# Patient Record
Sex: Female | Born: 1940 | Race: White | Hispanic: No | Marital: Married | Smoking: Former smoker
Health system: Southern US, Community
[De-identification: ages and names within clinical notes are randomized; demographics above are authoritative.]

## PROBLEM LIST (undated history)

## (undated) DIAGNOSIS — M5137 Other intervertebral disc degeneration, lumbosacral region: Secondary | ICD-10-CM

## (undated) DIAGNOSIS — M5441 Lumbago with sciatica, right side: Secondary | ICD-10-CM

## (undated) DIAGNOSIS — J449 Chronic obstructive pulmonary disease, unspecified: Secondary | ICD-10-CM

## (undated) DIAGNOSIS — I10 Essential (primary) hypertension: Secondary | ICD-10-CM

## (undated) HISTORY — PX: COLONOSCOPY: SHX174

## (undated) HISTORY — DX: Lumbago with sciatica, right side: M54.41

## (undated) HISTORY — DX: Essential (primary) hypertension: I10

## (undated) HISTORY — DX: Other intervertebral disc degeneration, lumbosacral region: M51.37

## (undated) HISTORY — DX: Chronic obstructive pulmonary disease, unspecified: J44.9

---

## 2010-01-15 HISTORY — PX: FRACTURE SURGERY: SHX138

## 2010-02-03 ENCOUNTER — Inpatient Hospital Stay (HOSPITAL_COMMUNITY): Admission: EM | Admit: 2010-02-03 | Discharge: 2010-02-05 | Payer: Self-pay | Admitting: Emergency Medicine

## 2010-11-02 LAB — DIFFERENTIAL
Basophils Absolute: 0 10*3/uL (ref 0.0–0.1)
Eosinophils Absolute: 0 10*3/uL (ref 0.0–0.7)
Monocytes Absolute: 0.6 10*3/uL (ref 0.1–1.0)
Monocytes Relative: 5 % (ref 3–12)
Neutro Abs: 10.1 10*3/uL — ABNORMAL HIGH (ref 1.7–7.7)
Neutrophils Relative %: 85 % — ABNORMAL HIGH (ref 43–77)

## 2010-11-02 LAB — COMPREHENSIVE METABOLIC PANEL WITH GFR
ALT: 10 U/L (ref 0–35)
AST: 16 U/L (ref 0–37)
Albumin: 4 g/dL (ref 3.5–5.2)
Alkaline Phosphatase: 52 U/L (ref 39–117)
BUN: 12 mg/dL (ref 6–23)
CO2: 22 meq/L (ref 19–32)
Calcium: 9.2 mg/dL (ref 8.4–10.5)
Chloride: 106 meq/L (ref 96–112)
Creatinine, Ser: 1.05 mg/dL (ref 0.4–1.2)
GFR calc non Af Amer: 52 mL/min — ABNORMAL LOW
Glucose, Bld: 111 mg/dL — ABNORMAL HIGH (ref 70–99)
Potassium: 4 meq/L (ref 3.5–5.1)
Sodium: 137 meq/L (ref 135–145)
Total Bilirubin: 0.7 mg/dL (ref 0.3–1.2)
Total Protein: 6.4 g/dL (ref 6.0–8.3)

## 2010-11-02 LAB — CBC
HCT: 29.5 % — ABNORMAL LOW (ref 36.0–46.0)
Hemoglobin: 10.1 g/dL — ABNORMAL LOW (ref 12.0–15.0)
MCHC: 33.6 g/dL (ref 30.0–36.0)
MCHC: 33.9 g/dL (ref 30.0–36.0)
MCHC: 34.1 g/dL (ref 30.0–36.0)
MCV: 85.2 fL (ref 78.0–100.0)
MCV: 85.3 fL (ref 78.0–100.0)
Platelets: 193 10*3/uL (ref 150–400)
Platelets: 214 K/uL (ref 150–400)
RBC: 3.46 MIL/uL — ABNORMAL LOW (ref 3.87–5.11)
RDW: 13.3 % (ref 11.5–15.5)
RDW: 13.3 % (ref 11.5–15.5)
RDW: 13.4 % (ref 11.5–15.5)
WBC: 9.3 K/uL (ref 4.0–10.5)

## 2010-11-02 LAB — BASIC METABOLIC PANEL WITH GFR
BUN: 8 mg/dL (ref 6–23)
CO2: 27 meq/L (ref 19–32)
Calcium: 8.4 mg/dL (ref 8.4–10.5)
Chloride: 108 meq/L (ref 96–112)
Creatinine, Ser: 0.97 mg/dL (ref 0.4–1.2)
GFR calc non Af Amer: 57 mL/min — ABNORMAL LOW
Glucose, Bld: 130 mg/dL — ABNORMAL HIGH (ref 70–99)
Potassium: 4.1 meq/L (ref 3.5–5.1)
Sodium: 141 meq/L (ref 135–145)

## 2010-11-02 LAB — URINALYSIS, ROUTINE W REFLEX MICROSCOPIC
Bilirubin Urine: NEGATIVE
Glucose, UA: NEGATIVE mg/dL
Hgb urine dipstick: NEGATIVE
Ketones, ur: NEGATIVE mg/dL
Nitrite: NEGATIVE
Protein, ur: NEGATIVE mg/dL
Specific Gravity, Urine: 1.011 (ref 1.005–1.030)
Urobilinogen, UA: 0.2 mg/dL (ref 0.0–1.0)
pH: 5.5 (ref 5.0–8.0)

## 2010-11-02 LAB — ABO/RH: ABO/RH(D): A POS

## 2010-11-02 LAB — TYPE AND SCREEN: Antibody Screen: NEGATIVE

## 2013-03-13 ENCOUNTER — Ambulatory Visit (INDEPENDENT_AMBULATORY_CARE_PROVIDER_SITE_OTHER): Payer: Medicare Other | Admitting: Physician Assistant

## 2013-03-13 ENCOUNTER — Encounter: Payer: Self-pay | Admitting: Physician Assistant

## 2013-03-13 VITALS — BP 190/114 | HR 84 | Temp 98.7°F | Resp 18 | Ht 60.75 in | Wt 136.0 lb

## 2013-03-13 DIAGNOSIS — M545 Low back pain, unspecified: Secondary | ICD-10-CM

## 2013-03-13 DIAGNOSIS — I10 Essential (primary) hypertension: Secondary | ICD-10-CM

## 2013-03-13 DIAGNOSIS — M5430 Sciatica, unspecified side: Secondary | ICD-10-CM

## 2013-03-13 DIAGNOSIS — M5431 Sciatica, right side: Secondary | ICD-10-CM

## 2013-03-13 MED ORDER — AMLODIPINE BESYLATE 5 MG PO TABS: 5.0000 mg | ORAL_TABLET | Freq: Every day | ORAL | Status: DC

## 2013-03-13 MED ORDER — PREDNISONE 20 MG PO TABS: ORAL_TABLET | ORAL | Status: DC

## 2013-03-13 MED ORDER — BENAZEPRIL HCL 10 MG PO TABS: 10.0000 mg | ORAL_TABLET | Freq: Every day | ORAL | Status: DC

## 2013-03-13 NOTE — Progress Notes (Signed)
Patient ID: Kristina Lucas MRN: [id number], DOB: 03-Dec-1940, 72 y.o. Date of Encounter: @DATE @  Chief Complaint:  Chief Complaint  Patient presents with  . New Pt-Establish    HPI: 72 y.o. year old white female  presents as new pt to establish care here. Has had no medical evaluation in 25 years. Had Right Hip surgery 3 years ago but "just had the surgery." No other medical care at all.   Has c/o pain in right buttock, going down lateral aspect of right leg. Was off and on but has been constant for past 6 weeks. Started after she had been bent over pulling weeds. She epsecially notices it when she first gets up in the mornings. She says Dr. Charlann Boxer told her it was not secondary to hre hip surgery.   She has no other complaints. Is interested in scheduling a CPE.    History reviewed. No pertinent past medical history.   Home Meds: See attached medication section for current medication list. Any medications entered into computer today will not appear on this note's list. The medications listed below were entered prior to today. No current outpatient prescriptions on file prior to visit.   No current facility-administered medications on file prior to visit.    Allergies: No Known Allergies  History   Social History  . Marital Status: Married    Spouse Name: N/A    Number of Children: N/A  . Years of Education: N/A   Occupational History  . Not on file.   Social History Main Topics  . Smoking status: Former Smoker -- 1.50 packs/day    Quit date: 04/17/2009  . Smokeless tobacco: Never Used  . Alcohol Use: No  . Drug Use: No  . Sexually Active: Not Currently   Other Topics Concern  . Not on file   Social History Narrative  . No narrative on file    Family History  Problem Relation Age of Onset  . Cancer Mother     colon  . Heart disease Father   . Hypertension Father   . Heart disease Brother      Review of Systems:  See HPI for pertinent ROS. All other ROS  negative.    Physical Exam: Blood pressure 190/114, pulse 84, temperature 98.7 F (37.1 C), temperature source Oral, resp. rate 18, height 5' 0.75" (1.543 m), weight 136 lb (61.689 kg)., Body mass index is 25.91 kg/(m^2).BP by me: 176/100 Left.  188/100 Right. General: WNWD WF. Appears in no acute distress. Neck: Supple. No thyromegaly. No lymphadenopathy.No carotid Bruit. Lungs: Clear bilaterally to auscultation without wheezes, rales, or rhonchi. Breathing is unlabored. Heart: RRR with S1 S2. No murmurs, rubs, or gallops. Musculoskeletal:  Strength and tone normal for age. Back: No pain with palpation of low back. She points o low back at approx L5-S1 level as area of occasional pain.  Left SLR, Hip Abd: Nml.  Right: SLR causes pain in low back at 45 degrees. Cannot abduct at all sec to ?hip pain-given her past surgery, I did not abduct.  Pain with palpation of Right Sciatic Notch.  2+ equal patella reflexes Bilaterally. Extremities/Skin: Warm and dry.  No edema. Neuro: Alert and oriented X 3. Moves all extremities spontaneously. Gait is normal. CNII-XII grossly in tact. Psych:  Responds to questions appropriately with a normal affect.     ASSESSMENT AND PLAN:  72 y.o. year old female with  1. HTN (hypertension) - COMPLETE METABOLIC PANEL WITH GFR - benazepril (LOTENSIN) 10 MG  tablet; Take 1 tablet (10 mg total) by mouth daily.  Dispense: 30 tablet; Refill: 0 - amLODipine (NORVASC) 5 MG tablet; Take 1 tablet (5 mg total) by mouth daily.  Dispense: 30 tablet; Refill: 0  2. Sciatica neuralgia, right - predniSONE (DELTASONE) 20 MG tablet; Take 3 daily for 2 days, then 2 daily for 2 days, then 1 daily for 2 days.  Dispense: 12 tablet; Refill: 0 - DG Lumbar Spine Complete; Future  3. Low back pain - DG Lumbar Spine Complete; Future  F/U OV 2 weeks, sooner if needed.  Will schedule CPE once these two issues are controlled.    [address] [place], [place], [facility] 03/13/2013 3:37  PM

## 2013-03-14 ENCOUNTER — Encounter: Payer: Self-pay | Admitting: Family Medicine

## 2013-03-14 ENCOUNTER — Ambulatory Visit
Admission: RE | Admit: 2013-03-14 | Discharge: 2013-03-14 | Disposition: A | Discharge status: Home / Self Care | Payer: Medicare Other | Source: Ambulatory Visit | Attending: Physician Assistant | Admitting: Physician Assistant

## 2013-03-14 DIAGNOSIS — M545 Low back pain: Secondary | ICD-10-CM

## 2013-03-14 DIAGNOSIS — M5431 Sciatica, right side: Secondary | ICD-10-CM

## 2013-03-14 LAB — COMPLETE METABOLIC PANEL WITH GFR
ALT: 11 U/L (ref 0–35)
AST: 18 U/L (ref 0–37)
Albumin: 4.7 g/dL (ref 3.5–5.2)
Alkaline Phosphatase: 53 U/L (ref 39–117)
BUN: 11 mg/dL (ref 6–23)
CO2: 27 mEq/L (ref 19–32)
Calcium: 10.1 mg/dL (ref 8.4–10.5)
Creat: 1 mg/dL (ref 0.50–1.10)
Glucose, Bld: 92 mg/dL (ref 70–99)
Total Protein: 7.2 g/dL (ref 6.0–8.3)

## 2013-03-27 ENCOUNTER — Encounter: Payer: Self-pay | Admitting: Physician Assistant

## 2013-03-27 ENCOUNTER — Ambulatory Visit (INDEPENDENT_AMBULATORY_CARE_PROVIDER_SITE_OTHER): Payer: Medicare Other | Admitting: Physician Assistant

## 2013-03-27 VITALS — BP 172/84 | HR 80 | Temp 98.6°F | Resp 18 | Wt 136.0 lb

## 2013-03-27 DIAGNOSIS — M5441 Lumbago with sciatica, right side: Secondary | ICD-10-CM

## 2013-03-27 DIAGNOSIS — I10 Essential (primary) hypertension: Secondary | ICD-10-CM

## 2013-03-27 DIAGNOSIS — M5430 Sciatica, unspecified side: Secondary | ICD-10-CM

## 2013-03-27 HISTORY — DX: Lumbago with sciatica, right side: M54.41

## 2013-03-27 MED ORDER — BENAZEPRIL HCL 20 MG PO TABS: 20.0000 mg | ORAL_TABLET | Freq: Every day | ORAL | Status: DC

## 2013-03-27 MED ORDER — AMLODIPINE BESYLATE 10 MG PO TABS: 10.0000 mg | ORAL_TABLET | Freq: Every day | ORAL | Status: DC

## 2013-03-28 NOTE — Progress Notes (Signed)
Patient ID: Kristina Lucas MRN: [id number], DOB: [birth year], 72 y.o. Date of Encounter: @DATE @  Chief Complaint:  Chief Complaint  Patient presents with  . 2 week follow up    legs still hurt    HPI: 72 y.o. year old white female  Presents for f/u OV. I saw her as a new pt to this practice 03/13/13. Prior to that, she had not had any medical evaluation in "25 years."  She presented with c/o pain in right buttock, going down lateral aspect of right leg. Had been intermittent for 6 weeks but was worsening. Had started after pulling weeds. Worse first thing in morning.  At that OV I prescribed Prednisone taper and obtained XRay Lumbar spine.  Today she reports that the pain never got better- even when on the prednisone.   Also, at LOV she was dx with HTN. Started Norvasc 5mg  and Benazepril 10mg . Seh is taking these QD. No adv effects. Seh has checked with BP cuff at home (which she thinks is accurate-her husband has used it and correlates with BP at his doctor's office.    Past Medical History  Diagnosis Date  . Hypertension   . Low back pain on right side with sciatica 03/27/2013     Home Meds: See attached medication section for current medication list. Any medications entered into computer today will not appear on this note's list. The medications listed below were entered prior to today. Current Outpatient Prescriptions on File Prior to Visit  Medication Sig Dispense Refill  . ibuprofen (ADVIL,MOTRIN) 200 MG tablet Take 200 mg by mouth every 6 (six) hours as needed for pain.       No current facility-administered medications on file prior to visit.    Allergies: No Known Allergies  History   Social History  . Marital Status: Married    Spouse Name: N/A    Number of Children: N/A  . Years of Education: N/A   Occupational History  . Not on file.   Social History Main Topics  . Smoking status: Former Smoker -- 1.50 packs/day    Quit date: 04/17/2009  . Smokeless tobacco:  Never Used  . Alcohol Use: No  . Drug Use: No  . Sexually Active: Not Currently   Other Topics Concern  . Not on file   Social History Narrative  . No narrative on file    Family History  Problem Relation Age of Onset  . Cancer Mother     colon  . Heart disease Father   . Hypertension Father   . Heart disease Brother      Review of Systems:  See HPI for pertinent ROS. All other ROS negative.    Physical Exam: Blood pressure 172/84, pulse 80, temperature 98.6 F (37 C), temperature source Oral, resp. rate 18, weight 136 lb (61.689 kg)., Body mass index is 25.91 kg/(m^2). BP by me 150/74 Right Arm General: WNWD WF. Appears in no acute distress. Neck: Supple. No thyromegaly. No lymphadenopathy.No carotid bruits. Lungs: Clear bilaterally to auscultation without wheezes, rales, or rhonchi. Breathing is unlabored. Heart: RRR with S1 S2. No murmurs, rubs, or gallops. Musculoskeletal:  Strength and tone normal for age. Back: No paoin with palpation of back. Positive pain with palpation of Right Sciatic notch. Left SLR, Hip Abd nml.  Unable to abduct on Right-has had hip surgery. 2+, equal patella reflexes bilaterally. Extremities/Skin: Warm and dry. No clubbing or cyanosis. No edema. No rashes or suspicious lesions. Neuro: Alert and oriented X  3. Moves all extremities spontaneously. Gait is normal. CNII-XII grossly in tact. Psych:  Responds to questions appropriately with a normal affect.     ASSESSMENT AND PLAN:  72 y.o. year old female with  1. 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 Will refer her to spine specialist and they can order any further imaging necessary  - Ambulatory referral to Orthopedic Surgery  2. Hypertension Will increase both meds: - amLODipine (NORVASC) 10 MG tablet; Take 1 tablet (10 mg total) by mouth  daily.  Dispense: 90 tablet; Refill: 3 - benazepril (LOTENSIN) 20 MG tablet; Take 1 tablet (20 mg total) by mouth daily.  Dispense: 90 tablet; Refill: 3 Sched f/u OV 2 weeks to recheck BP, BMET. She actually wants to schedule this as CPE. Will come fasting.   Murray Hodgkins [place], [place], [facility] 03/28/2013 6:46 AM

## 2013-04-12 ENCOUNTER — Encounter: Payer: Self-pay | Admitting: Internal Medicine

## 2013-04-12 ENCOUNTER — Ambulatory Visit (INDEPENDENT_AMBULATORY_CARE_PROVIDER_SITE_OTHER): Payer: Medicare Other | Admitting: Physician Assistant

## 2013-04-12 ENCOUNTER — Encounter: Payer: Self-pay | Admitting: Physician Assistant

## 2013-04-12 VITALS — BP 132/70 | HR 84 | Temp 98.2°F | Resp 20 | Ht 62.0 in | Wt 136.0 lb

## 2013-04-12 DIAGNOSIS — Z0000 Encounter for general adult medical examination without abnormal findings: Secondary | ICD-10-CM

## 2013-04-12 DIAGNOSIS — M5441 Lumbago with sciatica, right side: Secondary | ICD-10-CM

## 2013-04-12 DIAGNOSIS — Z800 Family history of malignant neoplasm of digestive organs: Secondary | ICD-10-CM

## 2013-04-12 DIAGNOSIS — I10 Essential (primary) hypertension: Secondary | ICD-10-CM

## 2013-04-12 DIAGNOSIS — Z23 Encounter for immunization: Secondary | ICD-10-CM

## 2013-04-12 DIAGNOSIS — M5430 Sciatica, unspecified side: Secondary | ICD-10-CM

## 2013-04-12 LAB — CBC WITH DIFFERENTIAL/PLATELET
Basophils Absolute: 0 10*3/uL (ref 0.0–0.1)
Eosinophils Absolute: 0.1 10*3/uL (ref 0.0–0.7)
Eosinophils Relative: 1 % (ref 0–5)
Lymphocytes Relative: 26 % (ref 12–46)
Lymphs Abs: 1.4 10*3/uL (ref 0.7–4.0)
MCH: 29 pg (ref 26.0–34.0)
Monocytes Relative: 7 % (ref 3–12)
Neutro Abs: 3.4 10*3/uL (ref 1.7–7.7)
RBC: 4.86 MIL/uL (ref 3.87–5.11)
RDW: 14 % (ref 11.5–15.5)

## 2013-04-12 LAB — COMPLETE METABOLIC PANEL WITH GFR
Albumin: 5 g/dL (ref 3.5–5.2)
Calcium: 10.1 mg/dL (ref 8.4–10.5)
Creat: 1.04 mg/dL (ref 0.50–1.10)
GFR, Est Non African American: 55 mL/min — ABNORMAL LOW
Glucose, Bld: 99 mg/dL (ref 70–99)
Sodium: 141 mEq/L (ref 135–145)
Total Protein: 7.5 g/dL (ref 6.0–8.3)

## 2013-04-12 LAB — LIPID PANEL
Cholesterol: 215 mg/dL — ABNORMAL HIGH (ref 0–200)
Triglycerides: 124 mg/dL (ref <150)

## 2013-04-12 NOTE — Progress Notes (Signed)
Patient ID: Kristina Lucas MRN: [id number], DOB: 13-Apr-1941, 72 y.o. Date of Encounter: 04/12/2013,   Chief Complaint: Physical (CPE)  HPI: 72 y.o. y/o white female  here for CPE.  She has no active complaints today.  I saw her as a new patient to this practice on 03/13/2013. Prior to that she had not had any medical evaluation for 25 years.  03/13/2013 she presented with complaints of pain in the right but radiating down the lateral aspect of the right leg. At that office visit I prescribed prednisone taper and obtained x-rays lumbar spine. She'll followup office visit 811 and 14. She reported that the pain never got better even while she was taking the prednisone. At that office visit on 03/28/2013 I was going to order an MRI. However then I reamed member that she has hardware in her hip. Therefore we instead scheduled her to see a spine specialist. Today she tells me that she did go to Dr. Cassandria Santee his office. She had a visit with his PA. She is currently in physical therapy. She has an appointment scheduled with Dr. Cassandria Santee on September 12. It was felt that hopefully the physical therapy can improve her condition. If it does not then he would probably do injections.  At her initial visit with meals 03/13/2013 she was also found to have hypertension. At that visit I started Norvasc 5 mg and benazepril 10 mg. At her followup office visit on 03/27/2013 blood pressure was improved but still elevated. We increased the Norvasc to 10 mg and increase the benazepril to 20 mg. Today she does report that she is taking both of these at the current doses. She is having no adverse effects. No lower extremity edema.   Review of Systems: Consitutional: No fever, chills, fatigue, night sweats, lymphadenopathy. No significant/unexplained weight changes. Eyes: No visual changes, eye redness, or discharge. ENT/Mouth: No ear pain, sore throat, nasal drainage, or sinus pain. Cardiovascular: No chest pressure,heaviness,  tightness or squeezing, even with exertion. No increased shortness of breath or dyspnea on exertion.No palpitations, edema, orthopnea, PND. Respiratory: No cough, hemoptysis, SOB, or wheezing. Gastrointestinal: No anorexia, dysphagia, reflux, pain, nausea, vomiting, hematemesis, diarrhea, constipation, BRBPR, or melena. Breast: No mass, nodules, bulging, or retraction. No skin changes or inflammation. No nipple discharge. No lymphadenopathy. Genitourinary: No dysuria, hematuria, incontinence, vaginal discharge, pruritis, burning, abnormal bleeding, or pain. Musculoskeletal: No decreased ROM, No joint pain or swelling. No significant pain in neck, back, or extremities. Skin: No rash, pruritis, or concerning lesions. Neurological: No headache, dizziness, syncope, seizures, tremors, memory loss, coordination problems, or paresthesias. Psychological: No anxiety, depression, hallucinations, SI/HI. Endocrine: No polydipsia, polyphagia, polyuria, or known diabetes.No increased fatigue. No palpitations/rapid heart rate. No significant/unexplained weight change. All other systems were reviewed and are otherwise negative.  Past Medical History  Diagnosis Date  . Hypertension   . Low back pain on right side with sciatica 03/27/2013     Past Surgical History  Procedure Laterality Date  . Fracture surgery Right 01/2010    hip    Home Meds:  Current Outpatient Prescriptions on File Prior to Visit  Medication Sig Dispense Refill  . amLODipine (NORVASC) 10 MG tablet Take 1 tablet (10 mg total) by mouth daily.  90 tablet  3  . benazepril (LOTENSIN) 20 MG tablet Take 1 tablet (20 mg total) by mouth daily.  90 tablet  3  . ibuprofen (ADVIL,MOTRIN) 200 MG tablet Take 200 mg by mouth every 6 (six) hours as needed for pain.  No current facility-administered medications on file prior to visit.    Allergies: No Known Allergies  History   Social History  . Marital Status: Married    Spouse Name:  N/A    Number of Children: N/A  . Years of Education: N/A   Occupational History  . Not on file.   Social History Main Topics  . Smoking status: Former Smoker -- 1.50 packs/day    Quit date: 04/17/2009  . Smokeless tobacco: Never Used  . Alcohol Use: No  . Drug Use: No  . Sexual Activity: Not Currently   Other Topics Concern  . Not on file   Social History Narrative   Married.    2 Children. Both Boys.   First Baby Died at 34 months old of an infection.   After had Third Baby stayed home for 15 years until her boys were grown..     Family History  Problem Relation Age of Onset  . Cancer Mother 15    colon  . Heart disease Father   . Hypertension Father   . Heart disease Brother     Physical Exam: Blood pressure 132/70, pulse 84, temperature 98.2 F (36.8 C), temperature source Oral, resp. rate 20, height 5\' 2"  (1.575 m), weight 136 lb (61.689 kg)., Body mass index is 24.87 kg/(m^2). General: Well developed, well nourished, white female. in no acute distress. HEENT: Normocephalic, atraumatic. Conjunctiva pink, sclera non-icteric. Pupils 2 mm constricting to 1 mm, round, regular, and equally reactive to light and accomodation. EOMI. Internal auditory canal clear. TMs with good cone of light and without pathology. Nasal mucosa pink. Nares are without discharge. No sinus tenderness. Oral mucosa pink.  Pharynx without exudate.   Neck: Supple. Trachea midline. No thyromegaly. Full ROM. No lymphadenopathy.No Carotid Bruits. Lungs: Clear to auscultation bilaterally without wheezes, rales, or rhonchi. Breathing is of normal effort and unlabored. Cardiovascular: RRR with S1 S2. No murmurs, rubs, or gallops. Distal pulses 2+ symmetrically. No carotid or abdominal bruits. Breast: Symmetrical. No masses. Nipples without discharge. Abdomen: Soft, non-tender, non-distended with normoactive bowel sounds. No hepatosplenomegaly or masses. No rebound/guarding. No CVA tenderness. No hernias.   Genitourinary:  External genitalia without lesions. Vaginal mucosa pink.No discharge present. Cervix pink and without discharge. No cervical tenderness.Normal uterus size. No adnexal mass or tenderness.  Pap smear taken. Musculoskeletal: Full range of motion and 5/5 strength throughout. Without swelling, atrophy, tenderness, crepitus, or warmth. Extremities without clubbing, cyanosis, or edema. Calves supple. Skin: Warm and moist without erythema, ecchymosis, wounds, or rash. Neuro: A+Ox3. CN II-XII grossly intact. Moves all extremities spontaneously. Full sensation throughout. Normal gait. DTR 2+ throughout upper and lower extremities. Finger to nose intact. Psych:  Responds to questions appropriately with a normal affect.   Assessment/Plan:  72 y.o. y/o female here for CPE 1. Visit for preventive health examination A. screening labs. She is fasting. - CBC with Differential - COMPLETE METABOLIC PANEL WITH GFR - Lipid panel - TSH - Vit D  25 hydroxy (rtn osteoporosis monitoring)  B. She's had no Pap smear in over 20 years. We'll go ahead and do Pap smear. - PAP, ThinPrep, Imaging, Medicare  C. screening colonoscopy: She says that she had one colonoscopy approximately 20 years ago. She is due for a colonoscopy regardless. However she also does have positive family history she definitely needs to proceed with colonoscopy. Her mom was diagnosed with colon cancer at age 28. She is agreeable to follow up with this. - Ambulatory referral to Gastroenterology  D. screening mammogram-she is not ever had one. She is agreeable E. screening bone density test: She has never had one she is agreeable-will schedule a mammogram and bone density to be at the same day same time - DG Bone Density; Future - MM Digital Screening; Future  E. Vaccines: Tetanus: She should have had this with her hip surgery just a few years ago Pneumococcal: She has never had one. We'll do right now and she would not need a  repeat. - Pneumococcal polysaccharide vaccine 23-valent greater than or equal to 2yo subcutaneous/IM Zostavax: She will need to check with her insurance regarding the cost of this.  2. Family history of colon cancer Followup with colonoscopy. See C above.   3. Hypertension Now at goal!  Continue current medication. - COMPLETE METABOLIC PANEL WITH GFR  4. Low back pain on right side with sciatica Followup with Dr. Cassandria Santee as above in history of present illness  5. Immunization due - Pneumococcal polysaccharide vaccine 23-valent greater than or equal to 2yo subcutaneous/IM  Regular office visit in 6 months sooner if needed.  [address] [place], [place], [facility] 04/12/2013 9:56 AM

## 2013-04-13 LAB — PAP, THIN PREP, IMAGING, MEDICARE

## 2013-04-14 ENCOUNTER — Other Ambulatory Visit: Payer: Self-pay | Admitting: Family Medicine

## 2013-04-14 DIAGNOSIS — M8580 Other specified disorders of bone density and structure, unspecified site: Secondary | ICD-10-CM

## 2013-05-09 ENCOUNTER — Ambulatory Visit
Admission: RE | Admit: 2013-05-09 | Discharge: 2013-05-09 | Disposition: A | Discharge status: Home / Self Care | Payer: Medicare Other | Source: Ambulatory Visit | Attending: Physician Assistant | Admitting: Physician Assistant

## 2013-05-09 DIAGNOSIS — M8580 Other specified disorders of bone density and structure, unspecified site: Secondary | ICD-10-CM

## 2013-05-09 DIAGNOSIS — Z0000 Encounter for general adult medical examination without abnormal findings: Secondary | ICD-10-CM

## 2013-05-09 LAB — HM DEXA SCAN: HM Dexa Scan: NORMAL

## 2013-05-11 ENCOUNTER — Other Ambulatory Visit: Payer: Self-pay | Admitting: Physician Assistant

## 2013-05-11 DIAGNOSIS — R928 Other abnormal and inconclusive findings on diagnostic imaging of breast: Secondary | ICD-10-CM

## 2013-05-24 ENCOUNTER — Encounter: Payer: Self-pay | Admitting: Family Medicine

## 2013-05-26 ENCOUNTER — Ambulatory Visit
Admission: RE | Admit: 2013-05-26 | Discharge: 2013-05-26 | Disposition: A | Discharge status: Home / Self Care | Payer: Medicare Other | Source: Ambulatory Visit | Attending: Physician Assistant | Admitting: Physician Assistant

## 2013-05-26 DIAGNOSIS — R928 Other abnormal and inconclusive findings on diagnostic imaging of breast: Secondary | ICD-10-CM

## 2013-05-30 HISTORY — PX: STERIOD INJECTION: SHX5046

## 2013-06-19 ENCOUNTER — Ambulatory Visit (AMBULATORY_SURGERY_CENTER): Payer: Self-pay

## 2013-06-19 VITALS — Ht 62.0 in | Wt 135.8 lb

## 2013-06-19 DIAGNOSIS — Z800 Family history of malignant neoplasm of digestive organs: Secondary | ICD-10-CM

## 2013-06-19 MED ORDER — MOVIPREP 100 G PO SOLR: 1.0000 | Freq: Once | ORAL | Status: DC

## 2013-06-20 ENCOUNTER — Encounter: Payer: Self-pay | Admitting: Internal Medicine

## 2013-07-03 ENCOUNTER — Encounter: Payer: Self-pay | Admitting: Internal Medicine

## 2013-07-03 ENCOUNTER — Ambulatory Visit (INDEPENDENT_AMBULATORY_CARE_PROVIDER_SITE_OTHER)
Admission: RE | Admit: 2013-07-03 | Discharge: 2013-07-03 | Disposition: A | Discharge status: Home / Self Care | Payer: Medicare Other | Source: Ambulatory Visit | Attending: Internal Medicine | Admitting: Internal Medicine

## 2013-07-03 ENCOUNTER — Ambulatory Visit (AMBULATORY_SURGERY_CENTER): Payer: Medicare Other | Admitting: Internal Medicine

## 2013-07-03 VITALS — BP 140/75 | HR 120 | Temp 97.0°F | Resp 27 | Ht 62.0 in | Wt 135.0 lb

## 2013-07-03 DIAGNOSIS — R0789 Other chest pain: Secondary | ICD-10-CM

## 2013-07-03 DIAGNOSIS — D126 Benign neoplasm of colon, unspecified: Secondary | ICD-10-CM

## 2013-07-03 DIAGNOSIS — Z1211 Encounter for screening for malignant neoplasm of colon: Secondary | ICD-10-CM

## 2013-07-03 DIAGNOSIS — Z800 Family history of malignant neoplasm of digestive organs: Secondary | ICD-10-CM

## 2013-07-03 MED ORDER — SODIUM CHLORIDE 0.9 % IV SOLN: 500.0000 mL | INTRAVENOUS | Status: DC

## 2013-07-03 MED ORDER — ALBUTEROL SULFATE (5 MG/ML) 0.5% IN NEBU: 2.5000 mg | INHALATION_SOLUTION | Freq: Once | RESPIRATORY_TRACT | Status: DC

## 2013-07-03 MED ORDER — ALBUTEROL SULFATE HFA 108 (90 BASE) MCG/ACT IN AERS: 1.0000 | INHALATION_SPRAY | RESPIRATORY_TRACT | Status: DC | PRN

## 2013-07-03 NOTE — Progress Notes (Signed)
Pt received as documented IV sedation slowly Pre O2 well.Increased O2 6 liters, Recently stopped smoking, coughing during procedure, pt allowed to wake up tolerated rest of procedure well. Sa02 decreased pt awake talking, repsonding, BBS course, suctioned many times for small amount secretions, order inhalational Albuterol in RR, awake HOB elevated Sa02 on 4 liters 90 % LB

## 2013-07-03 NOTE — Op Note (Addendum)
[facility] [address] [place], [address]   COLONOSCOPY PROCEDURE REPORT  PATIENT: Kristina Lucas, Kristina Lucas  MR#: [id number] BIRTHDATE: [birth year] , 72  yrs. old GENDER: Female ENDOSCOPIST: Beverley Fiedler, MD REFERRED XB:JYNW Dionicio Stall, PA-C PROCEDURE DATE:  07/03/2013 PROCEDURE:   Colonoscopy with snare polypectomy First Screening Colonoscopy - Avg.  risk and is 50 yrs.  old or older - No.  Prior Negative Screening - Now for repeat screening. 10 or more years since last screening  History of Adenoma - Now for follow-up colonoscopy & has been > or = to 3 yrs.  N/A  Polyps Removed Today? Yes. ASA CLASS:   Class III INDICATIONS:elevated risk screening and Patient's immediate family history of colon cancer.  last colonoscopy > 20 years ago MEDICATIONS: MAC sedation, administered by CRNA; propofol 80 mg IV  DESCRIPTION OF PROCEDURE:   After the risks benefits and alternatives of the procedure were thoroughly explained, informed consent was obtained.  A digital rectal exam revealed no rectal mass.   The LB [id number]-[id number]  endoscope was introduced through the anus and advanced to the cecum, which was identified by both the appendix and ileocecal valve. No adverse events experienced. The quality of the prep was good, using MoviPrep  The instrument was then slowly withdrawn as the colon was fully examined.   COLON FINDINGS: Three sessile polyps measuring 4-5 mm in size were found in the descending colon and sigmoid colon.  Polypectomy was performed using cold snare.  All resections were complete and all polyp tissue was completely retrieved.   There was moderate diverticulosis noted in the sigmoid colon with associated muscular hypertrophy.  Retroflexed views revealed no abnormalities. The time to cecum=4 minutes 01 seconds.  Withdrawal time=16 minutes 05 seconds.  The scope was withdrawn and the procedure completed. COMPLICATIONS: There were no complications.  ENDOSCOPIC  IMPRESSION: 1.   Three sessile polyps measuring 4-5 mm in size were found in the descending colon and sigmoid colon; Polypectomy was performed using cold snare 2.   There was moderate diverticulosis noted in the sigmoid colon  RECOMMENDATIONS: 1.  Await pathology results 2.  High fiber diet 3.  Timing of repeat colonoscopy will be determined by pathology findings. 4.  You will receive a letter within 1-2 weeks with the results of your biopsy as well as final recommendations.  Please call my office if you have not received a letter after 3 weeks.  eSigned:  Beverley Fiedler, MD 07/03/2013 10:32 AMRevised: 07/03/2013 10:32 AM   cc: The Patient and Frazier Richards MD

## 2013-07-03 NOTE — Progress Notes (Signed)
Called to room to assist during endoscopic procedure.  Patient ID and intended procedure confirmed with present staff. Received instructions for my participation in the procedure from the performing physician.  

## 2013-07-03 NOTE — Progress Notes (Addendum)
1610 pt brought to recovery sats in the low 80's, pt having chest tightness and wet productive cough. Per dr pyrtle, give 1 alb nebule. Neb given with chest tightness improving per pt. Pt was placed on 3 liters nasal cannula 02 and decreasing as tolerated per pt. ewm  Upon discharge, dr pyrtle ordered cxr before going home and pt aware. Script for albuterol inhaler sent to pt's pharmacy and pt aware needs to pick up today.  Pt sats 90 on room air and dr pyrtle reexamined pt prior to discharge. Dr pyrtle's Virgie Dad to call pt with appt with pcp per dr Rhea Belton. ewm  Patient did not experience any of the following events: a burn prior to discharge; a fall within the facility; wrong site/side/patient/procedure/implant event; or a hospital transfer or hospital admission upon discharge from the facility. ([id number])Patient did not have preoperative order for IV antibiotic SSI prophylaxis. [phone]) ewm

## 2013-07-03 NOTE — Patient Instructions (Signed)
YOU HAD AN ENDOSCOPIC PROCEDURE TODAY AT THE  ENDOSCOPY CENTER: Refer to the procedure report that was given to you for any specific questions about what was found during the examination.  If the procedure report does not answer your questions, please call your gastroenterologist to clarify.  If you requested that your care partner not be given the details of your procedure findings, then the procedure report has been included in a sealed envelope for you to review at your convenience later.  YOU SHOULD EXPECT: Some feelings of bloating in the abdomen. Passage of more gas than usual.  Walking can help get rid of the air that was put into your GI tract during the procedure and reduce the bloating. If you had a lower endoscopy (such as a colonoscopy or flexible sigmoidoscopy) you may notice spotting of blood in your stool or on the toilet paper. If you underwent a bowel prep for your procedure, then you may not have a normal bowel movement for a few days.  DIET: Your first meal following the procedure should be a light meal and then it is ok to progress to your normal diet.  A half-sandwich or bowl of soup is an example of a good first meal.  Heavy or fried foods are harder to digest and may make you feel nauseous or bloated.  Likewise meals heavy in dairy and vegetables can cause extra gas to form and this can also increase the bloating.  Drink plenty of fluids but you should avoid alcoholic beverages for 24 hours.  ACTIVITY: Your care partner should take you home directly after the procedure.  You should plan to take it easy, moving slowly for the rest of the day.  You can resume normal activity the day after the procedure however you should NOT DRIVE or use heavy machinery for 24 hours (because of the sedation medicines used during the test).    SYMPTOMS TO REPORT IMMEDIATELY: A gastroenterologist can be reached at any hour.  During normal business hours, 8:30 AM to 5:00 PM Monday through Friday,  call [phone].  After hours and on weekends, please call the GI answering service at [phone]  Emergency number who will take a message and have the physician on call contact you.   Following lower endoscopy (colonoscopy or flexible sigmoidoscopy):  Excessive amounts of blood in the stool  Significant tenderness or worsening of abdominal pains  Swelling of the abdomen that is new, acute  Fever of 100F or higher  FOLLOW UP: If any biopsies were taken you will be contacted by phone or by letter within the next 1-3 weeks.  Call your gastroenterologist if you have not heard about the biopsies in 3 weeks.  Our staff will call the home number listed on your records the next business day following your procedure to check on you and address any questions or concerns that you may have at that time regarding the information given to you following your procedure. This is a courtesy call and so if there is no answer at the home number and we have not heard from you through the emergency physician on call, we will assume that you have returned to your regular daily activities without incident.  SIGNATURES/CONFIDENTIALITY: You and/or your care partner have signed paperwork which will be entered into your electronic medical record.  These signatures attest to the fact that that the information above on your After Visit Summary has been reviewed and is understood.  Full responsibility of the   confidentiality of this discharge information lies with you and/or your care-partner.  Handouts on polyps, diverticulosis, high fiber diet 

## 2013-07-04 ENCOUNTER — Telehealth: Payer: Self-pay | Admitting: *Deleted

## 2013-07-04 NOTE — Telephone Encounter (Signed)
  Follow up Call-  Call back number 07/03/2013  Post procedure Call Back phone  # [phone]  Permission to leave phone message Yes     Patient questions:  Do you have a fever, pain , or abdominal swelling? no Pain Score  0 *  Have you tolerated food without any problems? yes  Have you been able to return to your normal activities? yes  Do you have any questions about your discharge instructions: Diet   no Medications  no Follow up visit  no  Do you have questions or concerns about your Care? no  Actions: * If pain score is 4 or above: No action needed, pain <4. Pt states she feels absolutely fine this am, no chest tightness, no shortness of breath. Pt states she has used her inhaler twice yesterday only and no use this am. She states she doesn't feel like she needs it so far. Pt to call and follow up with her PCP about her respiratory issues. ewm

## 2013-07-06 ENCOUNTER — Ambulatory Visit (INDEPENDENT_AMBULATORY_CARE_PROVIDER_SITE_OTHER): Payer: Medicare Other | Admitting: Physician Assistant

## 2013-07-06 ENCOUNTER — Encounter: Payer: Self-pay | Admitting: Physician Assistant

## 2013-07-06 VITALS — BP 110/72 | HR 100 | Temp 98.3°F | Resp 20 | Ht 61.0 in | Wt 134.0 lb

## 2013-07-06 DIAGNOSIS — J22 Unspecified acute lower respiratory infection: Secondary | ICD-10-CM

## 2013-07-06 DIAGNOSIS — J988 Other specified respiratory disorders: Secondary | ICD-10-CM

## 2013-07-06 DIAGNOSIS — J449 Chronic obstructive pulmonary disease, unspecified: Secondary | ICD-10-CM

## 2013-07-06 MED ORDER — AZITHROMYCIN 250 MG PO TABS: ORAL_TABLET | ORAL | Status: DC

## 2013-07-06 MED ORDER — PREDNISONE 20 MG PO TABS: ORAL_TABLET | ORAL | Status: DC

## 2013-07-06 NOTE — Progress Notes (Signed)
Patient ID: Kristina Lucas MRN: [id number], DOB: 1941/04/13, 72 y.o. Date of Encounter: 07/06/2013, 11:49 AM    Chief Complaint:  Chief Complaint  Patient presents with  . COPD    had cCXR done when she had colonoscopy on monday told her to see her PCP     HPI: 72 y.o. year old white female to followup the above.  I saw her as a new patient to this practice on 03/13/13. Prior to that she had not had any medical evaluation for 25 years.  At her initial visit I evaluated her regarding some pain in her right buttock radiating down the right leg. At that initial visit on same found that she had hypertension.  Most recently, I saw her for complete physical exam on 04/12/13. At that physical we reviewed that she had not had a colonoscopy in approximately 20 years.  She was agreeable to followup with this.  Today she reports that she did have her colonoscopy performed. She's reports the following: She states that they were able to complete the colonoscopy procedure. However states that towards the end of the procedure she developed some coughing and congestion. Says that when she started to "wake up "  she felt some burning in her throat and felt short of breath. She says that the doctor told her she was having an asthma attack. Says that she was treated with some fluids and breathing treatments. Says that they obtained a chest x-ray and prescribed her an albuterol inhaler to use at home.  I was able to read the progress notes documented by the staff during the procedure. The CRNA note does document that she had some coughing during the procedure. She tolerated the rest of the procedure well. States that she was given oxygen 6 L during the procedure. Their notes documents that the oxygen saturation decreased some. However at that point the patient was awake talking responding. Exam was notable for coarse bilateral breath sounds. She was suctioned many times with small amount of secretions. They  ordered albuterol inhalation. Oxygen sat was 90% on 4 L.  RN note 10 minutes later states that in the recovery area sats were in the low 80s, patient having chest tightness somewhat productive cough. Per Dr. Delray Alt was instructed to give 1 albuterol nebulizer. Nebulizer was given and chest tightness improved per patient. Patient was placed on 3 L nasal cannula oxygen and this was decreased decreased as tolerated. Upon discharge Dr. Delray Alt ordered chest x-ray prior to going patient going for him. Prescription was sent for albuterol inhaler to the patient's pharmacy for her to pick up.  Today patient reports that she is only needed to use albuterol inhaler twice since being home. However she says that she does feel some wheezing.  Says that she is noticing some wheezing now. It also she is noticing that she does have increased phlegm compared to her usual.  She quit smoking 4 years 6 months ago. Says that prior to this procedure she had no difficulty with shortness of breath wheezing or cough.     Home Meds: See attached medication section for any medications that were entered at today's visit. The computer does not put those onto this list.The following list is a list of meds entered prior to today's visit.   Current Outpatient Prescriptions on File Prior to Visit  Medication Sig Dispense Refill  . albuterol (PROVENTIL HFA;VENTOLIN HFA) 108 (90 BASE) MCG/ACT inhaler Inhale 1-2 puffs into the lungs every 4 (four)  hours as needed for wheezing or shortness of breath.  1 Inhaler  0  . amLODipine (NORVASC) 10 MG tablet Take 1 tablet (10 mg total) by mouth daily.  90 tablet  3  . benazepril (LOTENSIN) 20 MG tablet Take 1 tablet (20 mg total) by mouth daily.  90 tablet  3  . ibuprofen (ADVIL,MOTRIN) 200 MG tablet Take 200 mg by mouth every 6 (six) hours as needed for pain.       No current facility-administered medications on file prior to visit.    Allergies: No Known Allergies    Review of  Systems: See HPI for pertinent ROS. All other ROS negative.    Physical Exam: Blood pressure 110/72, pulse 100, temperature 98.3 F (36.8 C), temperature source Oral, resp. rate 20, height 5\' 1"  (1.549 m), weight 134 lb (60.782 kg)., Body mass index is 25.33 kg/(m^2). SaO2 was 96% on room air. General: WNWD WF.  Appears in no acute distress. HEENT: Normocephalic, atraumatic, eyes without discharge, sclera non-icteric, nares are without discharge. Bilateral auditory canals clear, TM's are without perforation, pearly grey and translucent with reflective cone of light bilaterally. Oral cavity moist, posterior pharynx without exudate, erythema, peritonsillar abscess, or post nasal drip.  Neck: Supple. No thyromegaly. No lymphadenopathy. Lungs: She has very slight wheeze on the left. On the right, lung is clear. Good air movement. Heart: Regular rhythm. No murmurs, rubs, or gallops. Msk:  Strength and tone normal for age. Extremities/Skin: Warm and dry. No clubbing or cyanosis. No edema. No rashes or suspicious lesions. Neuro: Alert and oriented X 3. Moves all extremities spontaneously. Gait is normal. CNII-XII grossly in tact. Psych:  Responds to questions appropriately with a normal affect.   She had chest x-ray performed 07/03/13. This shows changes consistent with COPD. Also showed  " slightly increased left perihilar markings versus prior study, cannot exclude subtle left perihilar/left lower lobe infiltrate."  ASSESSMENT AND PLAN:  72 y.o. year old female with  1. Lower respiratory infection - azithromycin (ZITHROMAX) 250 MG tablet; Day 1; Take 2 daily.   Days 2-5: Take 1 daily  Dispense: 6 tablet; Refill: 0 - predniSONE (DELTASONE) 20 MG tablet; Take 3 daily for 2 days, then 2 daily for 2 days, then 1 daily for 2 days.  Dispense: 12 tablet; Refill: 0  2. COPD (chronic obstructive pulmonary disease) - predniSONE (DELTASONE) 20 MG tablet; Take 3 daily for 2 days, then 2 daily for 2 days,  then 1 daily for 2 days.  Dispense: 12 tablet; Refill: 0  I told her to take the antibiotic and prednisone as directed above. Also told her to go ahead and use the albuterol inhaler 4 times a day for 5 days then can decrease to when necessary dosing. Follow up is symptoms worsen or do not resolve.  Signed, [address] [place], [place], [facility] 07/06/2013 11:49 AM

## 2013-07-07 ENCOUNTER — Encounter: Payer: Self-pay | Admitting: Internal Medicine

## 2013-07-12 ENCOUNTER — Encounter: Payer: Self-pay | Admitting: Family Medicine

## 2013-07-12 DIAGNOSIS — M51379 Other intervertebral disc degeneration, lumbosacral region without mention of lumbar back pain or lower extremity pain: Secondary | ICD-10-CM

## 2013-07-12 DIAGNOSIS — M5137 Other intervertebral disc degeneration, lumbosacral region: Secondary | ICD-10-CM

## 2013-07-12 HISTORY — DX: Other intervertebral disc degeneration, lumbosacral region without mention of lumbar back pain or lower extremity pain: M51.379

## 2013-07-12 HISTORY — DX: Other intervertebral disc degeneration, lumbosacral region: M51.37

## 2013-10-16 ENCOUNTER — Ambulatory Visit (INDEPENDENT_AMBULATORY_CARE_PROVIDER_SITE_OTHER): Payer: Medicare Other | Admitting: Physician Assistant

## 2013-10-16 ENCOUNTER — Encounter: Payer: Self-pay | Admitting: Physician Assistant

## 2013-10-16 VITALS — BP 164/86 | HR 80 | Temp 98.7°F | Resp 18 | Ht 62.25 in | Wt 131.0 lb

## 2013-10-16 DIAGNOSIS — I10 Essential (primary) hypertension: Secondary | ICD-10-CM

## 2013-10-16 DIAGNOSIS — Z23 Encounter for immunization: Secondary | ICD-10-CM

## 2013-10-16 LAB — BASIC METABOLIC PANEL WITH GFR
BUN: 21 mg/dL (ref 6–23)
CALCIUM: 9.9 mg/dL (ref 8.4–10.5)
CHLORIDE: 101 meq/L (ref 96–112)
CO2: 27 meq/L (ref 19–32)
Creat: 1.05 mg/dL (ref 0.50–1.10)
GFR, Est African American: 62 mL/min
GFR, Est Non African American: 54 mL/min — ABNORMAL LOW
GLUCOSE: 96 mg/dL (ref 70–99)
POTASSIUM: 5 meq/L (ref 3.5–5.3)
SODIUM: 139 meq/L (ref 135–145)

## 2013-10-16 MED ORDER — AMLODIPINE BESYLATE 10 MG PO TABS: 10.0000 mg | ORAL_TABLET | Freq: Every day | ORAL | Status: DC

## 2013-10-16 MED ORDER — BENAZEPRIL HCL 20 MG PO TABS: 20.0000 mg | ORAL_TABLET | Freq: Every day | ORAL | Status: DC

## 2013-10-17 NOTE — Progress Notes (Signed)
Patient ID: Lorene Klimas MRN: [id number], DOB: [birth year], 73 y.o. Date of Encounter: @DATE @  Chief Complaint:  Chief Complaint  Patient presents with  . 6 mth check up    HPI: 73 y.o. year old white female  presents for routine six-month followup visit.  She states that she has not yet taken her blood pressure medication this morning. Says that she has not been checking her blood pressure recently. Does have a blood pressure cuff at home that she can check her blood pressure easily.  Says that she needs me to send in a refill on her albuterol. Says that she had a virus earlier this month and had to use albuterol twice a day on some days this month. Other than around  the time of this virus, she had not been needing her albuterol at all.  I saw her as a new patient to this practice on 03/13/13. Prior to that she had not had any medical evaluation for 25 years.  On 03/13/2013 she presented with complaints of pain in the right buttock, radiating down the lateral aspect of the right leg. She subsequently underwent further evaluation and treatment by me and subsequently was referred to Dr. Nelva Bush. Since being seen at his office she has done physical therapy there. As well she says that she has had 1 injection by him. Says that the pain and symptoms have been much better and has not had to require any further injections as far.  At her initial visit with me 03/13/13 she was also found to have hypertension. We have gradually titrated her medications to where she is now currently on amlodipine 10 mg daily and benazepril 20 mg daily.  She came in and had a complete physical exam with me on 04/12/13. See that note for all preventive care details.   Past Medical History  Diagnosis Date  . Hypertension   . Low back pain on right side with sciatica 03/27/2013  . COPD (chronic obstructive pulmonary disease)   . DDD (degenerative disc disease), lumbosacral 07/12/2013     Home Meds: See attached  medication section for current medication list. Any medications entered into computer today will not appear on this note's list. The medications listed below were entered prior to today. Current Outpatient Prescriptions on File Prior to Visit  Medication Sig Dispense Refill  . albuterol (PROVENTIL HFA;VENTOLIN HFA) 108 (90 BASE) MCG/ACT inhaler Inhale 1-2 puffs into the lungs every 4 (four) hours as needed for wheezing or shortness of breath.  1 Inhaler  0  . ibuprofen (ADVIL,MOTRIN) 200 MG tablet Take 200 mg by mouth every 6 (six) hours as needed for pain.       No current facility-administered medications on file prior to visit.    Allergies: No Known Allergies  History   Social History  . Marital Status: Married    Spouse Name: N/A    Number of Children: N/A  . Years of Education: N/A   Occupational History  . Not on file.   Social History Main Topics  . Smoking status: Former Smoker -- 1.50 packs/day    Quit date: 04/17/2009  . Smokeless tobacco: Never Used  . Alcohol Use: No  . Drug Use: No  . Sexual Activity: Not Currently   Other Topics Concern  . Not on file   Social History Narrative   Married.    2 Children. Both Boys.   First Baby Died at 90 months old of an infection.   After  had Third Baby stayed home for 15 years until her boys were grown..     Family History  Problem Relation Age of Onset  . Cancer Mother 93    colon  . Colon cancer Mother   . Heart disease Father   . Hypertension Father   . Heart disease Brother      Review of Systems:  See HPI for pertinent ROS. All other ROS negative.    Physical Exam: Blood pressure 164/86, pulse 80, temperature 98.7 F (37.1 C), temperature source Oral, resp. rate 18, height 5' 2.25" (1.581 m), weight 131 lb (59.421 kg)., Body mass index is 23.77 kg/(m^2). General: WNWD WF. Appears in no acute distress. Neck: Supple. No thyromegaly. No lymphadenopathy. No carotid bruit. Lungs: Clear bilaterally to  auscultation without wheezes, rales, or rhonchi. Breathing is unlabored. Heart: RRR with S1 S2. No murmurs, rubs, or gallops. Musculoskeletal:  Strength and tone normal for age. Extremities/Skin: Warm and dry. No clubbing or cyanosis. No edema.  Neuro: Alert and oriented X 3. Moves all extremities spontaneously. Gait is normal. CNII-XII grossly in tact. Psych:  Responds to questions appropriately with a normal affect.     ASSESSMENT AND PLAN:  73 y.o. year old female with  1. Hypertension She will start checking her blood pressure at home. If gets > 725 systolic or >36 diastolic, then she is to call and schedule a followup visit with me. Not increase medications based on today's reading. Last reading was excellent at 644/03. - BASIC METABOLIC PANEL WITH GFR - benazepril (LOTENSIN) 20 MG tablet; Take 1 tablet (20 mg total) by mouth daily.  Dispense: 90 tablet; Refill: 3 - amLODipine (NORVASC) 10 MG tablet; Take 1 tablet (10 mg total) by mouth daily.  Dispense: 90 tablet; Refill: 3  2. Need for Tdap vaccination - Tdap vaccine greater than or equal to 7yo IM  The following section is copied from her CPE note done 04/12/13 so I can easily review the preventive care that was done at that time.: Assessment/Plan:  73 y.o. y/o female here for CPE  1. Visit for preventive health examination  A. screening labs. She is fasting.  - CBC with Differential  - COMPLETE METABOLIC PANEL WITH GFR  - Lipid panel  - TSH  - Vit D 25 hydroxy (rtn osteoporosis monitoring)  B. She's had no Pap smear in over 20 years. We'll go ahead and do Pap smear.  - PAP, ThinPrep, Imaging, Medicare  C. screening colonoscopy:  She says that she had one colonoscopy approximately 20 years ago. She is due for a colonoscopy regardless. However she also does have positive family history she definitely needs to proceed with colonoscopy. Her mom was diagnosed with colon cancer at age 106. She is agreeable to follow up with this.    - Ambulatory referral to Gastroenterology                                                                             TODAY SHE REPORTS SHE HAD THIS DONE--2 POLYPS--TOLD TO REPEAT 5 YEARS.  D. screening mammogram-she is not ever had one. She is agreeable   --TODAY SHE REPORTS SHE HAD THIS--HAD A 2ND MAMMO/ULTRASOUND TO CONFIRM--WAS NEGATIVE--F/U  1 YEAR  E. screening bone density test: She has never had one she is agreeable-will schedule a mammogram and bone density to be at the same day same time  - DG Bone Density; Future                                                                                                      --TODAY SHE REPORTS SHE HAD THIS--STARTED CALCIUM AND VITRAMIN D AS INSTRUCTED  - MM Digital Screening; Future  E. Vaccines:  Tetanus: She should have had this with her hip surgery just a few years ago -                     -ACTUALLY REALIZED TETANUS NOT UPDATED PRE-OP--PT AGREEABLE TO GET THIS TODAY  Pneumococcal: She has never had one. We'll do right now and she would not need a repeat. --GIVEN NEW GUIDELINES: --AT LEAST 12 MONTHS SHOULD LAPSE, THEN SHE WILL NEED                                                                                                                                                                       PREVNAR 13.  - Pneumococcal polysaccharide vaccine 23-valent greater than or equal to 2yo subcutaneous/IM   Zostavax: She will need to check with her insurance regarding the cost of this.  2. Family history of colon cancer  Followup with colonoscopy. See C above.  3. Hypertension  Now at goal! Continue current medication.  - COMPLETE METABOLIC PANEL WITH GFR  4. Low back pain on right side with sciatica  Followup with Dr. Staci Acosta as above in history of present illness  5. Immunization due  - Pneumococcal polysaccharide vaccine 23-valent greater than or equal to 2yo subcutaneous/IM   Signed, [facility] [place], [place], [facility] 10/17/2013 2:52 PM

## 2014-04-18 ENCOUNTER — Encounter: Payer: Self-pay | Admitting: Physician Assistant

## 2014-04-18 ENCOUNTER — Ambulatory Visit (INDEPENDENT_AMBULATORY_CARE_PROVIDER_SITE_OTHER): Payer: Medicare Other | Admitting: Physician Assistant

## 2014-04-18 VITALS — BP 140/74 | HR 72 | Temp 98.6°F | Resp 18 | Wt 133.0 lb

## 2014-04-18 DIAGNOSIS — Z23 Encounter for immunization: Secondary | ICD-10-CM

## 2014-04-18 DIAGNOSIS — I10 Essential (primary) hypertension: Secondary | ICD-10-CM

## 2014-04-18 DIAGNOSIS — J438 Other emphysema: Secondary | ICD-10-CM

## 2014-04-18 DIAGNOSIS — J439 Emphysema, unspecified: Secondary | ICD-10-CM

## 2014-04-18 LAB — BASIC METABOLIC PANEL WITH GFR
BUN: 18 mg/dL (ref 6–23)
CHLORIDE: 102 meq/L (ref 96–112)
CO2: 27 mEq/L (ref 19–32)
Calcium: 10.3 mg/dL (ref 8.4–10.5)
Creat: 1.06 mg/dL (ref 0.50–1.10)
GFR, Est African American: 61 mL/min
GFR, Est Non African American: 53 mL/min — ABNORMAL LOW
GLUCOSE: 93 mg/dL (ref 70–99)
POTASSIUM: 4.8 meq/L (ref 3.5–5.3)
SODIUM: 139 meq/L (ref 135–145)

## 2014-04-18 NOTE — Progress Notes (Signed)
Patient ID: Kristina Lucas MRN: [id number], DOB: May 16, 1941, 73 y.o. Date of Encounter: @DATE @  Chief Complaint:  Chief Complaint  Patient presents with  . Hypertension    HPI: 73 y.o. year old white female  presents for routine six-month followup visit.  I saw her as a new patient to this practice on 03/13/13. Prior to that she had not had any medical evaluation for 25 years.  On 03/13/2013 she presented with complaints of pain in the right buttock, radiating down the lateral aspect of the right leg. She subsequently underwent further evaluation and treatment by me and subsequently was referred to Dr. Nelva Bush. Since being seen at his office she has done physical therapy there. As well she says that she has had 1 injection by him. Says that the pain and symptoms have been much better and has not had to require any further injections as far.  At her initial visit with me 03/13/13 she was also found to have hypertension. We have gradually titrated her medications to where she is now currently on amlodipine 10 mg daily and benazepril 20 mg daily.  She came in and had a complete physical exam with me on 04/12/13.   Today she states that she has continued to feel very good. She stays active.  Today she is hoping that the weather is going to be get this Saturday so her grandchildren have their part they pool party as planned !!!!!  She states that she very rarely needs to use her albuterol inhaler. She is taking her blood pressure medications as directed and no adverse effects.  She has no complaints today.   Past Medical History  Diagnosis Date  . Hypertension   . Low back pain on right side with sciatica 03/27/2013  . COPD (chronic obstructive pulmonary disease)   . DDD (degenerative disc disease), lumbosacral 07/12/2013     Home Meds:  Outpatient Prescriptions Prior to Visit  Medication Sig Dispense Refill  . albuterol (PROVENTIL HFA;VENTOLIN HFA) 108 (90 BASE) MCG/ACT inhaler Inhale  1-2 puffs into the lungs every 4 (four) hours as needed for wheezing or shortness of breath.  1 Inhaler  0  . amLODipine (NORVASC) 10 MG tablet Take 1 tablet (10 mg total) by mouth daily.  90 tablet  3  . benazepril (LOTENSIN) 20 MG tablet Take 1 tablet (20 mg total) by mouth daily.  90 tablet  3  . Calcium Carbonate-Vitamin D (CALCIUM + D PO) Take 1 tablet by mouth 2 (two) times daily with a meal.      . ibuprofen (ADVIL,MOTRIN) 200 MG tablet Take 200 mg by mouth every 6 (six) hours as needed for pain.       No facility-administered medications prior to visit.     Allergies: No Known Allergies  History   Social History  . Marital Status: Married    Spouse Name: N/A    Number of Children: N/A  . Years of Education: N/A   Occupational History  . Not on file.   Social History Main Topics  . Smoking status: Former Smoker -- 1.50 packs/day    Quit date: 04/17/2009  . Smokeless tobacco: Never Used  . Alcohol Use: No  . Drug Use: No  . Sexual Activity: Not Currently   Other Topics Concern  . Not on file   Social History Narrative   Married.    2 Children. Both Boys.   First Baby Died at 9 months old of an infection.  After had Third Baby stayed home for 15 years until her boys were grown..     Family History  Problem Relation Age of Onset  . Cancer Mother 23    colon  . Colon cancer Mother   . Heart disease Father   . Hypertension Father   . Heart disease Brother      Review of Systems:  See HPI for pertinent ROS. All other ROS negative.    Physical Exam: Blood pressure 140/74, pulse 72, temperature 98.6 F (37 C), resp. rate 18, weight 133 lb (60.328 kg)., Body mass index is 24.14 kg/(m^2). General: WNWD WF. Appears in no acute distress. Neck: Supple. No thyromegaly. No lymphadenopathy. No carotid bruit. Lungs: Clear bilaterally to auscultation without wheezes, rales, or rhonchi. Breathing is unlabored. Heart: RRR with S1 S2. No murmurs, rubs, or  gallops. Musculoskeletal:  Strength and tone normal for age. Extremities/Skin: Warm and dry. No clubbing or cyanosis. No edema.  Neuro: Alert and oriented X 3. Moves all extremities spontaneously. Gait is normal. CNII-XII grossly in tact. Psych:  Responds to questions appropriately with a normal affect.     ASSESSMENT AND PLAN:  73 y.o. year old female with   1. Essential hypertension Blood pressure control. Check labs monitor. Continue current medication. - BASIC METABOLIC PANEL WITH GFR  2. Pulmonary emphysema, unspecified emphysema type This is controlled and stable. Continue albuterol when necessary.    2. Family history of colon cancer  She did followup with colonoscopy. See "C" below.     THE FOLLOWING IS COPIED FROM HER CPE 03/2013: PREVENTIVE CARE: A. screening labs. --Done 03/2013 - CBC with Differential  - COMPLETE METABOLIC PANEL WITH GFR  - Lipid panel  - TSH  - Vit D 25 hydroxy (rtn osteoporosis monitoring)   B. At [place] 03/2013 she reported that she had not had Pap smear in over 20 years.   - PAP, ThinPrep, Imaging, Medicare ---Done 03/2013  C. screening colonoscopy:  At [place] 03/2013 she said that she had one colonoscopy approximately 20 years ago. She was due for a colonoscopy regardless. However she also does have positive family history she definitely needs to proceed with colonoscopy. Her mom was diagnosed with colon cancer at age 25. She was agreeable to follow up with this.  I ordered Referral to GI at [place] 03/2013. She reports that she did have colonoscopy. Revealed 2 polyps. Told to repeat 5 years.  D. screening mammogram:  AT [facility] 03/2013 she reported she had never had one. She was agreeable   The followup office visit she reported that she had this done. Had a second mammogram/ultrasound to confirm. Was negative. Was told to followup one year.   E. screening bone density test:  At [facility] 8/2014She had never had one-- she was agreeable- scheduled bone density  At  Followup office visit she reported that she did have the bone density scan performed and stated that she did start calcium and vitamin D as instructed.  F. Vaccines:  Tetanus: Given here 10/16/2013  Pneumococcal: Pneumovax 23 --given here 04/12/2013.                                                                                         Prevnar 13---Given here 04/18/2014  Zostavax: She did check with her insurance regarding the cost of this. $95. Pt defers.     She will need to followup for routine followup visit in 6 months. Followup sooner if needed.   Marin Olp [place], [place], [facility] 04/18/2014 8:13 AM

## 2014-04-19 ENCOUNTER — Encounter: Payer: Self-pay | Admitting: *Deleted

## 2014-05-01 ENCOUNTER — Telehealth: Payer: Self-pay | Admitting: Physician Assistant

## 2014-05-01 NOTE — Telephone Encounter (Signed)
LM pt is needing to schedule [facility] AND CPE

## 2014-05-23 ENCOUNTER — Encounter: Payer: Medicare Other | Admitting: Physician Assistant

## 2014-10-18 ENCOUNTER — Ambulatory Visit (INDEPENDENT_AMBULATORY_CARE_PROVIDER_SITE_OTHER): Payer: Medicare Other | Admitting: Physician Assistant

## 2014-10-18 ENCOUNTER — Other Ambulatory Visit: Payer: Self-pay

## 2014-10-18 ENCOUNTER — Encounter: Payer: Self-pay | Admitting: Physician Assistant

## 2014-10-18 VITALS — BP 144/74 | HR 76 | Temp 98.3°F | Resp 18 | Ht 62.0 in | Wt 130.0 lb

## 2014-10-18 DIAGNOSIS — I10 Essential (primary) hypertension: Secondary | ICD-10-CM | POA: Diagnosis not present

## 2014-10-18 DIAGNOSIS — M5137 Other intervertebral disc degeneration, lumbosacral region: Secondary | ICD-10-CM

## 2014-10-18 DIAGNOSIS — R252 Cramp and spasm: Secondary | ICD-10-CM | POA: Diagnosis not present

## 2014-10-18 DIAGNOSIS — J439 Emphysema, unspecified: Secondary | ICD-10-CM | POA: Diagnosis not present

## 2014-10-18 DIAGNOSIS — Z1231 Encounter for screening mammogram for malignant neoplasm of breast: Secondary | ICD-10-CM

## 2014-10-18 LAB — BASIC METABOLIC PANEL WITH GFR
BUN: 13 mg/dL (ref 6–23)
CHLORIDE: 104 meq/L (ref 96–112)
CO2: 27 meq/L (ref 19–32)
Calcium: 9.6 mg/dL (ref 8.4–10.5)
Creat: 1.06 mg/dL (ref 0.50–1.10)
GFR, Est African American: 61 mL/min
GFR, Est Non African American: 53 mL/min — ABNORMAL LOW
Glucose, Bld: 92 mg/dL (ref 70–99)
Potassium: 4.6 mEq/L (ref 3.5–5.3)
SODIUM: 139 meq/L (ref 135–145)

## 2014-10-18 MED ORDER — CYCLOBENZAPRINE HCL 10 MG PO TABS: ORAL_TABLET | ORAL | Status: DC

## 2014-10-18 NOTE — Progress Notes (Signed)
Patient ID: Kristina Lucas MRN: [id number], DOB: 1941-04-18, 74 y.o. Date of Encounter: @DATE @  Chief Complaint:  Chief Complaint  Patient presents with  . 6 mth visit    is fasting    HPI: 74 y.o. year old white female  presents for routine six-month followup visit.  I saw her as a new patient to this practice on 03/13/13. Prior to that she had not had any medical evaluation for 25 years.  On 03/13/2013 she presented with complaints of pain in the right buttock, radiating down the lateral aspect of the right leg. She subsequently underwent further evaluation and treatment by me and subsequently was referred to Dr. Nelva Bush. Since being seen at his office she has done physical therapy there. As well she says that she has had 1 injection by him. Says that the pain and symptoms have been much better and has not had to require any further injections thus far. Says Dr. Nelva Bush told her he could do surgery.  At [facility] 10/18/14--pt says pain stable. Rarely needs med for pain. When does, just uses Advil.  Has required no further injections.   At her initial visit with me 03/13/13 she was also found to have hypertension. We have gradually titrated her medications to where she is now currently on amlodipine 10 mg daily and benazepril 20 mg daily. She is taking these as directed. No LE edema. No lightheadedness.   She came in and had a complete physical exam with me on 04/12/13.   Today she states that she has continued to feel very good. She stays active.  At [place] she was getting ready for pool party with grandkids.  Says she has 2 sons, 6 grandsons, and finally and great-grand daughter--the first girl!!! Grandkids range from 25 y/o to 87 y/o.    She states that she very rarely needs to use her albuterol inhaler. She is taking her blood pressure medications as directed and no adverse effects.  At [place] 10/18/14 she reports that 3 night she wakes up with cramps in her legs. She says that at different times it  happens in both legs.  No other complaints or concerns. No other updates regarding her medical conditions over the past 6 months.   Past Medical History  Diagnosis Date  . Hypertension   . Low back pain on right side with sciatica 03/27/2013  . COPD (chronic obstructive pulmonary disease)   . DDD (degenerative disc disease), lumbosacral 07/12/2013     Home Meds:  Outpatient Prescriptions Prior to Visit  Medication Sig Dispense Refill  . albuterol (PROVENTIL HFA;VENTOLIN HFA) 108 (90 BASE) MCG/ACT inhaler Inhale 1-2 puffs into the lungs every 4 (four) hours as needed for wheezing or shortness of breath. 1 Inhaler 0  . amLODipine (NORVASC) 10 MG tablet Take 1 tablet (10 mg total) by mouth daily. 90 tablet 3  . benazepril (LOTENSIN) 20 MG tablet Take 1 tablet (20 mg total) by mouth daily. 90 tablet 3  . Calcium Carbonate-Vitamin D (CALCIUM + D PO) Take 1 tablet by mouth 2 (two) times daily with a meal.    . ibuprofen (ADVIL,MOTRIN) 200 MG tablet Take 200 mg by mouth every 6 (six) hours as needed for pain.     No facility-administered medications prior to visit.     Allergies: No Known Allergies  History   Social History  . Marital Status: Married    Spouse Name: N/A  . Number of Children: N/A  . Years of Education:  N/A   Occupational History  . Not on file.   Social History Main Topics  . Smoking status: Former Smoker -- 1.50 packs/day    Quit date: 04/17/2009  . Smokeless tobacco: Never Used  . Alcohol Use: No  . Drug Use: No  . Sexual Activity: Not Currently   Other Topics Concern  . Not on file   Social History Narrative   Married.    2 Children. Both Boys.   First Baby Died at 32 months old of an infection.   After had Third Baby stayed home for 15 years until her boys were grown..     Family History  Problem Relation Age of Onset  . Cancer Mother 24    colon  . Colon cancer Mother   . Heart disease Father   . Hypertension Father   . Heart disease  Brother      Review of Systems:  See HPI for pertinent ROS. All other ROS negative.    Physical Exam: Blood pressure 144/74, pulse 76, temperature 98.3 F (36.8 C), temperature source Oral, resp. rate 18, height 5\' 2"  (1.575 m), weight 130 lb (58.968 kg)., Body mass index is 23.77 kg/(m^2). General: WNWD WF. Appears in no acute distress. Neck: Supple. No thyromegaly. No lymphadenopathy. No carotid bruit. Lungs: Clear bilaterally to auscultation without wheezes, rales, or rhonchi. Breathing is unlabored. Heart: RRR with S1 S2. No murmurs, rubs, or gallops. Musculoskeletal:  Strength and tone normal for age. Extremities/Skin: Warm and dry.  No edema.  Neuro: Alert and oriented X 3. Moves all extremities spontaneously. Gait is normal. CNII-XII grossly in tact. Psych:  Responds to questions appropriately with a normal affect.     ASSESSMENT AND PLAN:  74 y.o. year old female with   1. Essential hypertension Blood pressure control. Check labs monitor. Continue current medication. - BASIC METABOLIC PANEL WITH GFR  2. Pulmonary emphysema, unspecified emphysema type This is controlled and stable. Continue albuterol when necessary.    2. Family history of colon cancer  She did followup with colonoscopy. See "C" below.     THE FOLLOWING IS COPIED FROM HER CPE 03/2013: PREVENTIVE CARE: A. screening labs. --Done 03/2013 - CBC with Differential --------------------------------------------------Normal - COMPLETE METABOLIC PANEL WITH GFR -----------------Normal - Lipid panel ----------------------------------------------------------------Normal.  LDL--126,   HDL--64 - TSH ------------------------------------------------------------------------Normal - Vit D 25 hydroxy (rtn osteoporosis monitoring) -----------------Normal--39  B. At [place] 03/2013 she reported that she had not had Pap smear in over 20 years.   - PAP, ThinPrep, Imaging, Medicare ---Done 03/2013---Cytology Alone --Negative (No  HPV Cotesting)  C. screening colonoscopy:  At [place] 03/2013 she said that she had one colonoscopy approximately 20 years ago. She was due for a colonoscopy regardless. However she also does have positive family history she definitely needs to proceed with colonoscopy. Her mom was diagnosed with colon cancer at age 8. She was agreeable to follow up with this.  I ordered Referral to GI at [place] 03/2013. She reports that she did have colonoscopy. Revealed 2 polyps. Told to repeat 5 years.  D. screening mammogram:  AT [facility] 03/2013 she reported she had never had one. She was agreeable   The followup office visit she reported that she had this done. Had a second mammogram/ultrasound to confirm. Was negative. Was told to followup one year.  At [place] 10/18/14--pt says she has not called and scheduled f/u--says she will go home and call to schedule.   E. screening bone density test:  At [facility] 8/2014She  had never had one-- she was agreeable- scheduled bone density  At Followup office visit she reported that she did have the bone density scan performed and stated that she did start calcium and vitamin D as instructed.                                                                                                        F. Vaccines:  Tetanus: Given here 10/16/2013  Pneumococcal: Pneumovax 23 --given here 04/12/2013.                                                                                         Prevnar 13---Given here 04/18/2014  Zostavax: She did check with her insurance regarding the cost of this. $95. Pt defers.     She will need to followup for routine followup visit in 6 months. Followup sooner if needed.   Signed, [address] [place], [place], [facility] 10/18/2014 8:20 AM

## 2014-10-30 ENCOUNTER — Ambulatory Visit
Admission: RE | Admit: 2014-10-30 | Discharge: 2014-10-30 | Disposition: A | Discharge status: Home / Self Care | Payer: Medicare Other | Source: Ambulatory Visit

## 2014-10-30 DIAGNOSIS — Z1231 Encounter for screening mammogram for malignant neoplasm of breast: Secondary | ICD-10-CM | POA: Diagnosis not present

## 2014-11-01 ENCOUNTER — Telehealth: Payer: Self-pay | Admitting: *Deleted

## 2014-11-01 MED ORDER — TIZANIDINE HCL 4 MG PO TABS: 4.0000 mg | ORAL_TABLET | Freq: Every day | ORAL | Status: DC

## 2014-11-01 NOTE — Telephone Encounter (Signed)
Pt called back and made aware of the medication change d/t her insurance

## 2014-11-01 NOTE — Telephone Encounter (Signed)
Received letter from [facility].   Advised that Flexeril is no longer covered by insurance. Tizanidine noted to be listed as Tier 2.   PA made aware and new orders obtained to D/C Flexeril and begin Tizanidine 4mg  PO QHS.   Prescription sent to pharmacy.   Call placed to patient. [place].

## 2014-11-26 ENCOUNTER — Other Ambulatory Visit: Payer: Self-pay | Admitting: Physician Assistant

## 2014-11-26 NOTE — Telephone Encounter (Signed)
Medication refilled per protocol. 

## 2015-02-05 ENCOUNTER — Encounter: Payer: Self-pay | Admitting: *Deleted

## 2015-04-24 ENCOUNTER — Ambulatory Visit (INDEPENDENT_AMBULATORY_CARE_PROVIDER_SITE_OTHER): Payer: Medicare Other | Admitting: Physician Assistant

## 2015-04-24 ENCOUNTER — Encounter: Payer: Self-pay | Admitting: Physician Assistant

## 2015-04-24 VITALS — BP 124/60 | HR 68 | Temp 98.3°F | Resp 18 | Ht 62.0 in | Wt 128.0 lb

## 2015-04-24 DIAGNOSIS — M5137 Other intervertebral disc degeneration, lumbosacral region: Secondary | ICD-10-CM

## 2015-04-24 DIAGNOSIS — Z860100 Personal history of colon polyps, unspecified: Secondary | ICD-10-CM

## 2015-04-24 DIAGNOSIS — Z0000 Encounter for general adult medical examination without abnormal findings: Secondary | ICD-10-CM

## 2015-04-24 DIAGNOSIS — M8580 Other specified disorders of bone density and structure, unspecified site: Secondary | ICD-10-CM

## 2015-04-24 DIAGNOSIS — J439 Emphysema, unspecified: Secondary | ICD-10-CM

## 2015-04-24 DIAGNOSIS — M51379 Other intervertebral disc degeneration, lumbosacral region without mention of lumbar back pain or lower extremity pain: Secondary | ICD-10-CM

## 2015-04-24 DIAGNOSIS — I10 Essential (primary) hypertension: Secondary | ICD-10-CM | POA: Diagnosis not present

## 2015-04-24 LAB — BASIC METABOLIC PANEL WITH GFR
BUN: 17 mg/dL (ref 7–25)
CHLORIDE: 105 mmol/L (ref 98–110)
CO2: 25 mmol/L (ref 20–31)
Calcium: 9.6 mg/dL (ref 8.6–10.4)
Creat: 1.04 mg/dL — ABNORMAL HIGH (ref 0.60–0.93)
GFR, Est African American: 62 mL/min (ref >=60)
GFR, Est Non African American: 54 mL/min — ABNORMAL LOW (ref >=60)
Glucose, Bld: 91 mg/dL (ref 70–99)
Potassium: 4.5 mmol/L (ref 3.5–5.3)
Sodium: 139 mmol/L (ref 135–146)

## 2015-04-24 NOTE — Progress Notes (Signed)
Patient ID: Kristina Lucas MRN: [id number], DOB: 1941-06-09, 74 y.o. Date of Encounter: @DATE @  Chief Complaint:  Chief Complaint  Patient presents with  . Annual Exam    [employer] CPE    HPI: 74 y.o. year old white female  presents for [place].  I saw her as a new patient to this practice on 03/13/13. Prior to that she had not had any medical evaluation for 25 years.  On 03/13/2013 she presented with complaints of pain in the right buttock, radiating down the lateral aspect of the right leg. She subsequently underwent further evaluation and treatment by me and subsequently was referred to Dr. Nelva Bush. Since being seen at his office she has done physical therapy there. As well she says that she has had 1 injection by him. Says that the pain and symptoms have been much better and has not had to require any further injections thus far. Says Dr. Nelva Bush told her he could do surgery.  At [facility] 10/18/14--pt says pain stable. Rarely needs med for pain. When does, just uses Advil.  Has required no further injections.   At her initial visit with me 03/13/13 she was also found to have hypertension. We have gradually titrated her medications to where she is now currently on amlodipine 10 mg daily and benazepril 20 mg daily. She is taking these as directed. No LE edema. No lightheadedness.   She came in and had a complete physical exam with me on 04/12/13.   Today she states that she has continued to feel very good. She stays active.  At [place] she was getting ready for pool party with grandkids.  Says she has 2 sons, 6 grandsons, and finally and great-grand daughter--the first girl!!! Grandkids range from 65 y/o to 57 y/o.   At [place] 10/18/14 she reports that 3 night she wakes up with cramps in her legs. She says that at different times it happens in both legs.  She smoked in the past but quit around 2009.  She states that she very rarely needs to use her albuterol inhaler. She is taking her blood pressure  medications as directed and no adverse effects.   No other complaints or concerns. No other updates regarding her medical conditions over the past 6 months.   Past Medical History  Diagnosis Date  . Hypertension   . Low back pain on right side with sciatica 03/27/2013  . COPD (chronic obstructive pulmonary disease)   . DDD (degenerative disc disease), lumbosacral 07/12/2013     Home Meds:  Outpatient Prescriptions Prior to Visit  Medication Sig Dispense Refill  . albuterol (PROVENTIL HFA;VENTOLIN HFA) 108 (90 BASE) MCG/ACT inhaler Inhale 1-2 puffs into the lungs every 4 (four) hours as needed for wheezing or shortness of breath. 1 Inhaler 0  . amLODipine (NORVASC) 10 MG tablet TAKE 1 TABLET (10 MG TOTAL) BY MOUTH DAILY. 90 tablet 1  . benazepril (LOTENSIN) 20 MG tablet TAKE 1 TABLET (20 MG TOTAL) BY MOUTH DAILY. 90 tablet 1  . Calcium Carbonate-Vitamin D (CALCIUM + D PO) Take 1 tablet by mouth 2 (two) times daily with a meal.    . ibuprofen (ADVIL,MOTRIN) 200 MG tablet Take 200 mg by mouth every 6 (six) hours as needed for pain.    Marland Kitchen tiZANidine (ZANAFLEX) 4 MG tablet Take 1 tablet (4 mg total) by mouth at bedtime. (Patient not taking: Reported on 04/24/2015) 30 tablet 2   No facility-administered medications prior to visit.  Allergies: No Known Allergies  Social History   Social History  . Marital Status: Married    Spouse Name: N/A  . Number of Children: N/A  . Years of Education: N/A   Occupational History  . Not on file.   Social History Main Topics  . Smoking status: Former Smoker -- 1.50 packs/day    Quit date: 04/17/2009  . Smokeless tobacco: Never Used  . Alcohol Use: No  . Drug Use: No  . Sexual Activity: Not Currently   Other Topics Concern  . Not on file   Social History Narrative   Married.    2 Children. Both Boys.   First Baby Died at 86 months old of an infection.   After had Third Baby stayed home for 15 years until her boys were grown..      Family History  Problem Relation Age of Onset  . Cancer Mother 86    colon  . Colon cancer Mother   . Heart disease Father   . Hypertension Father   . Heart disease Brother      Review of Systems:  See HPI for pertinent ROS. All other ROS negative.    Physical Exam: Blood pressure 124/60, pulse 68, temperature 98.3 F (36.8 C), temperature source Oral, resp. rate 18, height 5\' 2"  (1.575 m), weight 128 lb (58.06 kg)., Body mass index is 23.41 kg/(m^2). General: WNWD WF. Appears in no acute distress. HEENT: Eyes are normal bilaterally. Ear exam is normal bilaterally. Canals are patent bilaterally. Tympanic membranes are normal bilaterally. Oral mucosa is normal with no lesions. Pharynx is normal with no erythema or exudate. Neck: Supple. No thyromegaly. No lymphadenopathy. No carotid bruit. Lungs: Clear bilaterally to auscultation without wheezes, rales, or rhonchi. Breathing is unlabored. Heart: RRR with S1 S2. No murmurs, rubs, or gallops. Breast Exam: Normal bilaterally. No masses. No nipple discharge. No skin changes. Pelvic Exam: She defers. She had pelvic exam and Pap smear with me August 2014 which were normal and she does not want to repeat this given age 89. Musculoskeletal:  Strength and tone normal for age. Extremities/Skin: Warm and dry.  No edema.  Neuro: Alert and oriented X 3. Moves all extremities spontaneously. Gait is normal. CNII-XII grossly in tact. Psych:  Responds to questions appropriately with a normal affect.     ASSESSMENT AND PLAN:  74 y.o. year old female with    1. Medicare annual wellness visit, subsequent  2. Visit for preventive health examination   5. DDD (degenerative disc disease), lumbosacral  6. Osteopenia  7. History of colonic polyps   1. Essential hypertension Blood pressure controlled/at goal. Check labs monitor. Continue current medication. - BASIC METABOLIC PANEL WITH GFR  2. Pulmonary emphysema, unspecified emphysema  type She quit smoking in approximately 2009 This is controlled and stable. Continue albuterol when necessary.  2. Family history of colon cancer   History of colonic polyps  She had colonoscopy 07/03/13. She reports that this did show polyp. She reports that she was told to repeat 5 years.   5. DDD (degenerative disc disease), lumbosacral See HPI. Managed by Dr. Nelva Bush  6. Osteopenia She had bone density test 05/09/2013.  Lumbar T score -1.0.  Left femur T score -1.5.  Consistent with low bone mass.  She started calcium and vitamin D at that time is continued taking this.  Repeat bone density test 2 years.    THE FOLLOWING IS COPIED FROM HER CPE 03/2013: PREVENTIVE CARE: A. screening labs. --Done  03/2013 - CBC with Differential --------------------------------------------------Normal - COMPLETE METABOLIC PANEL WITH GFR -----------------Normal - Lipid panel ----------------------------------------------------------------Normal.  LDL--126,   HDL--64 - TSH ------------------------------------------------------------------------Normal - Vit D 25 hydroxy (rtn osteoporosis monitoring) -----------------Normal--39  B. At [place] 03/2013 she reported that she had not had Pap smear in over 20 years.   - PAP, ThinPrep, Imaging, Medicare ---Done 03/2013---Cytology Alone --Negative (No HPV Cotesting) No further Pap smear needed given age 15  C. screening colonoscopy:  At [place] 03/2013 she said that she had one colonoscopy approximately 20 years ago. She was due for a colonoscopy regardless. However she also does have positive family history she definitely needs to proceed with colonoscopy. Her mom was diagnosed with colon cancer at age 30. She was agreeable to follow up with this.  I ordered Referral to GI at [place] 03/2013. She did have colonoscopy---07/03/2013. Per Pt--Revealed 2 polyps. Told to repeat 5 years.  D. screening mammogram:  AT [facility] 03/2013 she reported she had never had one. She was agreeable    The followup office visit she reported that she had this done. Had a second mammogram/ultrasound to confirm. Was negative. Was told to followup one year.  At [place] 10/18/14--pt says she has not called and scheduled f/u--says she will go home and call to schedule.  Last mammogram 10/30/2014 negative  E. screening bone density test:  At [place] 8/2014She had never had one-- she was agreeable- scheduled bone density  At Followup office visit she reported that she did have the bone density scan performed and stated that she did start calcium and vitamin D as instructed.                                                                                                Osteopenia She had bone density test 05/09/2013.  Lumbar T score -1.0.  Left femur T score -1.5.  Consistent with low bone mass.  She started calcium and vitamin D at that time is continued taking this.  Repeat bone density test 2 years.          F. Vaccines:  Tetanus: Given here 10/16/2013  Pneumococcal: Pneumovax 23 --given here 04/12/2013.                                                                                         Prevnar 13---Given here 04/18/2014  Zostavax: She did check with her insurance regarding the cost of this. $95. Pt defers.     She will need to followup for routine followup visit in 6 months. Followup sooner if needed.    Subjective:   Patient presents for Medicare Annual/Subsequent preventive examination.   Review Past Medical/Family/Social: These are all reviewed and updated and documented today.   Risk Factors  Current exercise habits:  She is very active. Dietary issues discussed: She eats a low-sodium low-cholesterol diet.  Cardiac risk factors: HTN, Family History, Age  Depression Screen  (Note: if answer to either of the following is "Yes", a more complete depression screening is indicated)  Over the past two weeks, have you felt down, depressed or hopeless? No Over the past two weeks, have you felt  little interest or pleasure in doing things? No Have you lost interest or pleasure in daily life? No Do you often feel hopeless? No Do you cry easily over simple problems? No   Activities of Daily Living  In your present state of health, do you have any difficulty performing the following activities?:  Driving? No  Managing money? No  Feeding yourself? No  Getting from bed to chair? No  Climbing a flight of stairs? No  Preparing food and eating?: No  Bathing or showering? No  Getting dressed: No  Getting to the toilet? No  Using the toilet:No  Moving around from place to place: No  In the past year have you fallen or had a near fall?:No  Are you sexually active? No  Do you have more than one partner? No   Hearing Difficulties: No  Do you often ask people to speak up or repeat themselves? No  Do you experience ringing or noises in your ears? No Do you have difficulty understanding soft or whispered voices? No  Do you feel that you have a problem with memory? No Do you often misplace items? No  Do you feel safe at home? Yes  Cognitive Testing  Alert? Yes Normal Appearance?Yes  Oriented to person? Yes Place? Yes  Time? Yes  Recall of three objects? Yes  Can perform simple calculations? Yes  Displays appropriate judgment?Yes  Can read the correct time from a watch face?Yes   List the Names of Other Physician/Practitioners you currently use:  She saw a GI doctor for colonoscopy. Sees no other doctors.   Indicate any recent Medical Services you may have received from other than [facility] providers in the past year (date may be approximate).  These are all documented above. Screening Tests / Date--------------- these are all documented above. Colonoscopy                     Zostavax  Mammogram  Influenza Vaccine  Tetanus/tdap    Assessment:    Annual wellness medicare exam   Plan:    During the course of the visit the patient was educated and counseled about  appropriate screening and preventive services including:  Screening mammography  Colorectal cancer screening  Shingles vaccine. Prescription given to that she can get the vaccine at the pharmacy or Medicare part D.  Screen + for depression. 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 We discussed the options of counseling versus possibly a medication. I encouraged her strongly think about the counseling. She is going through some medical problems currently and her husband is as well Mrs. been very stressful for her. She says she will think about it. She does have Xanax to use as needed. Though she may benefit from an SSRI for her more depressive type symptoms but she wants to hold off at this time.  I aksed her to please have her cardioloist send records since we have none on file.  Diet review for nutrition referral? Yes ____ Not Indicated __x__  Patient Instructions (the written plan) was given to the patient.  Medicare Attestation  I have  personally reviewed:  The patient's medical and social history  Their use of alcohol, tobacco or illicit drugs  Their current medications and supplements  The patient's functional ability including ADLs,fall risks, home safety risks, cognitive, and hearing and visual impairment  Diet and physical activities  Evidence for depression or mood disorders  The patient's weight, height, BMI, and visual acuity have been recorded in the chart. I have made referrals, counseling, and provided education to the patient based on review of the above and I have provided the patient with a written personalized care plan for preventive services.       Signed, [address] [place], [place], [facility] 04/24/2015 8:31 AM

## 2015-05-18 ENCOUNTER — Other Ambulatory Visit: Payer: Self-pay | Admitting: Physician Assistant

## 2015-05-20 NOTE — Telephone Encounter (Signed)
Medication refilled per protocol. 

## 2015-06-13 DIAGNOSIS — H5203 Hypermetropia, bilateral: Secondary | ICD-10-CM | POA: Diagnosis not present

## 2015-10-23 ENCOUNTER — Ambulatory Visit (INDEPENDENT_AMBULATORY_CARE_PROVIDER_SITE_OTHER): Payer: Medicare Other | Admitting: Physician Assistant

## 2015-10-23 ENCOUNTER — Encounter: Payer: Self-pay | Admitting: Physician Assistant

## 2015-10-23 ENCOUNTER — Other Ambulatory Visit: Payer: Self-pay

## 2015-10-23 VITALS — BP 128/68 | HR 80 | Temp 98.1°F | Resp 16 | Wt 132.0 lb

## 2015-10-23 DIAGNOSIS — Z1239 Encounter for other screening for malignant neoplasm of breast: Secondary | ICD-10-CM

## 2015-10-23 DIAGNOSIS — M5441 Lumbago with sciatica, right side: Secondary | ICD-10-CM | POA: Diagnosis not present

## 2015-10-23 DIAGNOSIS — M8580 Other specified disorders of bone density and structure, unspecified site: Secondary | ICD-10-CM

## 2015-10-23 DIAGNOSIS — J439 Emphysema, unspecified: Secondary | ICD-10-CM

## 2015-10-23 DIAGNOSIS — M5137 Other intervertebral disc degeneration, lumbosacral region: Secondary | ICD-10-CM | POA: Diagnosis not present

## 2015-10-23 DIAGNOSIS — I10 Essential (primary) hypertension: Secondary | ICD-10-CM

## 2015-10-23 DIAGNOSIS — Z1231 Encounter for screening mammogram for malignant neoplasm of breast: Secondary | ICD-10-CM

## 2015-10-23 DIAGNOSIS — Z86010 Personal history of colonic polyps: Secondary | ICD-10-CM | POA: Diagnosis not present

## 2015-10-23 LAB — BASIC METABOLIC PANEL WITH GFR
BUN: 16 mg/dL (ref 7–25)
CALCIUM: 9.4 mg/dL (ref 8.6–10.4)
CHLORIDE: 103 mmol/L (ref 98–110)
CO2: 25 mmol/L (ref 20–31)
CREATININE: 1.14 mg/dL — AB (ref 0.60–0.93)
GFR, EST AFRICAN AMERICAN: 56 mL/min — AB (ref >=60)
GFR, EST NON AFRICAN AMERICAN: 48 mL/min — AB (ref >=60)
Glucose, Bld: 95 mg/dL (ref 70–99)
Potassium: 4.2 mmol/L (ref 3.5–5.3)
SODIUM: 138 mmol/L (ref 135–146)

## 2015-10-23 NOTE — Progress Notes (Signed)
Patient ID: Kristina Lucas MRN: [id number], DOB: Mar 12, 1941, 75 y.o. Date of Encounter: @DATE @  Chief Complaint:  Chief Complaint  Patient presents with  . Medication Management    Pt fasting  . Medication Refill    HPI: 75 y.o. year old white female  presents for [facility] CPE.  I saw her as a new patient to this practice on 03/13/13. Prior to that she had not had any medical evaluation for 25 years.  On 03/13/2013 she presented with complaints of pain in the right buttock, radiating down the lateral aspect of the right leg. She subsequently underwent further evaluation and treatment by me and subsequently was referred to Dr. Nelva Bush. Since being seen at his office she has done physical therapy there. As well she says that she has had 1 injection by him. Says that the pain and symptoms have been much better and has not had to require any further injections thus far. Says Dr. Nelva Bush told her he could do surgery.  At [facility] 10/18/14--pt says pain stable. Rarely needs med for pain. When does, just uses Advil.  Has required no further injections.   At her initial visit with me 03/13/13 she was also found to have hypertension. We have gradually titrated her medications to where she is now currently on amlodipine 10 mg daily and benazepril 20 mg daily. She is taking these as directed. No LE edema. No lightheadedness.   She came in and had a complete physical exam with me on 04/12/13 and again 04/24/2015.   Today she states that she has continued to feel very good. She stays active.  At  Prior [facility] she was getting ready for pool party with grandkids.  Says she has 2 sons, 6 grandsons, and finally and great-grand daughter--the first girl!!! Grandkids range from 45 y/o to 65 y/o.   She smoked in the past but quit around 2009. She states that she very rarely needs to use her albuterol inhaler.  She is taking her blood pressure medications as directed and no adverse effects.   No other complaints or  concerns. No other updates regarding her medical conditions over the past 6 months.   Past Medical History  Diagnosis Date  . Hypertension   . Low back pain on right side with sciatica 03/27/2013  . COPD (chronic obstructive pulmonary disease) ([place])   . DDD (degenerative disc disease), lumbosacral 07/12/2013     Home Meds:  Outpatient Prescriptions Prior to Visit  Medication Sig Dispense Refill  . albuterol (PROVENTIL HFA;VENTOLIN HFA) 108 (90 BASE) MCG/ACT inhaler Inhale 1-2 puffs into the lungs every 4 (four) hours as needed for wheezing or shortness of breath. 1 Inhaler 0  . amLODipine (NORVASC) 10 MG tablet TAKE 1 TABLET (10 MG TOTAL) BY MOUTH DAILY. 90 tablet 1  . benazepril (LOTENSIN) 20 MG tablet TAKE 1 TABLET (20 MG TOTAL) BY MOUTH DAILY. 90 tablet 1  . Calcium Carbonate-Vitamin D (CALCIUM + D PO) Take 1 tablet by mouth 2 (two) times daily with a meal.    . ibuprofen (ADVIL,MOTRIN) 200 MG tablet Take 200 mg by mouth every 6 (six) hours as needed for pain.     No facility-administered medications prior to visit.     Allergies: No Known Allergies  Social History   Social History  . Marital Status: Married    Spouse Name: N/A  . Number of Children: N/A  . Years of Education: N/A   Occupational History  . Not on file.  Social History Main Topics  . Smoking status: Former Smoker -- 1.50 packs/day    Quit date: 04/17/2009  . Smokeless tobacco: Never Used  . Alcohol Use: No  . Drug Use: No  . Sexual Activity: Not Currently   Other Topics Concern  . Not on file   Social History Narrative   Married.    2 Children. Both Boys.   First Baby Died at 60 months old of an infection.   After had Third Baby stayed home for 15 years until her boys were grown..     Family History  Problem Relation Age of Onset  . Cancer Mother 42    colon  . Colon cancer Mother   . Heart disease Father   . Hypertension Father   . Heart disease Brother      Review of Systems:  See  HPI for pertinent ROS. All other ROS negative.    Physical Exam: Blood pressure 128/68, pulse 80, temperature 98.1 F (36.7 C), temperature source Oral, resp. rate 16, weight 132 lb (59.875 kg)., Body mass index is 24.14 kg/(m^2). General: WNWD WF. Appears in no acute distress. HEENT: Eyes are normal bilaterally. Ear exam is normal bilaterally. Canals are patent bilaterally. Tympanic membranes are normal bilaterally. Oral mucosa is normal with no lesions. Pharynx is normal with no erythema or exudate. Neck: Supple. No thyromegaly. No lymphadenopathy. No carotid bruit. Lungs: Clear bilaterally to auscultation without wheezes, rales, or rhonchi. Breathing is unlabored. Heart: RRR with S1 S2. No murmurs, rubs, or gallops. Breast Exam: Normal bilaterally. No masses. No nipple discharge. No skin changes. Pelvic Exam: She defers. She had pelvic exam and Pap smear with me August 2014 which were normal and she does not want to repeat this given age 75. Musculoskeletal:  Strength and tone normal for age. Extremities/Skin: Warm and dry.  No edema.  Neuro: Alert and oriented X 3. Moves all extremities spontaneously. Gait is normal. CNII-XII grossly in tact. Psych:  Responds to questions appropriately with a normal affect.     ASSESSMENT AND PLAN:  75 y.o. year old female with     1. Essential hypertension Blood pressure controlled/at goal. Check labs monitor. Continue current medication. - BASIC METABOLIC PANEL WITH GFR  2. Pulmonary emphysema, unspecified emphysema type She quit smoking in approximately 2009 This is controlled and stable. Continue albuterol when necessary.  3. Family history of colon cancer   History of colonic polyps She had colonoscopy 07/03/13. She reports that this did show polyp. She reports that she was told to repeat 5 years.  4. DDD (degenerative disc disease), lumbosacral See HPI. Managed by Dr. Nelva Bush  5. Osteopenia She had bone density test 05/09/2013.   Lumbar T score -1.0.  Left femur T score -1.5.  Consistent with low bone mass.  She started calcium and vitamin D at that time is continued taking this.  Repeat bone density test 2 years.  At [place] 10/23/2015--- I ordered f/u DEXA   THE FOLLOWING IS COPIED FROM HER CPE : PREVENTIVE CARE: A. screening labs.  - CBC with Differential --------------------------------------------------Normal - COMPLETE METABOLIC PANEL WITH GFR -----------------Normal - Lipid panel ----------------------------------------------------------------Normal.   - TSH ------------------------------------------------------------------------Normal - Vit D 25 hydroxy (rtn osteoporosis monitoring) -----------------Normal  B. At [place] 03/2013 she reported that she had not had Pap smear in over 20 years.   - PAP, ThinPrep, Imaging, Medicare ---Done 03/2013---Cytology Alone --Negative (No HPV Cotesting) No further Pap smear needed given age 52  C. screening colonoscopy:  At  [facility] 03/2013 she said that she had one colonoscopy approximately 20 years ago. She was due for a colonoscopy regardless. However she also does have positive family history she definitely needs to proceed with colonoscopy. Her mom was diagnosed with colon cancer at age 5. She was agreeable to follow up with this.  I ordered Referral to GI at [place] 03/2013. She did have colonoscopy---07/03/2013. Per Pt--Revealed 2 polyps. Told to repeat 5 years.  D. screening mammogram:  AT [place] 03/2013 she reported she had never had one. She was agreeable   The followup office visit she reported that she had this done. Had a second mammogram/ultrasound to confirm. Was negative. Was told to followup one year.  At [place] 10/18/14--pt says she has not called and scheduled f/u--says she will go home and call to schedule.  Last mammogram 10/30/2014 negative At [place] 10/2015-- i placed order for mammogram and DEXA scan to be performed the same day.  E. screening bone density test:  At [facility] 8/2014She had  never had one-- she was agreeable- scheduled bone density  At Followup office visit she reported that she did have the bone density scan performed and stated that she did start calcium and vitamin D as instructed.                                                                                                Osteopenia She had bone density test 05/09/2013.  Lumbar T score -1.0.  Left femur T score -1.5.  Consistent with low bone mass.  She started calcium and vitamin D at that time is continued taking this.  Repeat bone density test 2 years. AT [facility] 10/2015-- I placed order for DEXA and Mammogram to be performed at same time          F. Vaccines:  Tetanus: Given here 10/16/2013  Pneumococcal: Pneumovax 23 --given here 04/12/2013.                                                                                         Prevnar 13---Given here 04/18/2014  Zostavax: She did check with her insurance regarding the cost of this. $95. Pt defers.     She will need to followup for routine followup visit in 6 months. Followup sooner if needed.    Signed, [address] [place], [place], [facility] 10/23/2015 8:40 AM

## 2015-11-11 ENCOUNTER — Ambulatory Visit
Admission: RE | Admit: 2015-11-11 | Discharge: 2015-11-11 | Disposition: A | Discharge status: Home / Self Care | Payer: Medicare Other | Source: Ambulatory Visit | Attending: Physician Assistant | Admitting: Physician Assistant

## 2015-11-11 ENCOUNTER — Ambulatory Visit
Admission: RE | Admit: 2015-11-11 | Discharge: 2015-11-11 | Disposition: A | Discharge status: Home / Self Care | Payer: Medicare Other | Source: Ambulatory Visit

## 2015-11-11 DIAGNOSIS — M85852 Other specified disorders of bone density and structure, left thigh: Secondary | ICD-10-CM | POA: Diagnosis not present

## 2015-11-11 DIAGNOSIS — Z1231 Encounter for screening mammogram for malignant neoplasm of breast: Secondary | ICD-10-CM | POA: Diagnosis not present

## 2015-11-11 DIAGNOSIS — M8580 Other specified disorders of bone density and structure, unspecified site: Secondary | ICD-10-CM

## 2015-11-11 DIAGNOSIS — Z780 Asymptomatic menopausal state: Secondary | ICD-10-CM | POA: Diagnosis not present

## 2015-11-11 LAB — HM DEXA SCAN

## 2015-11-11 LAB — HM MAMMOGRAPHY

## 2015-11-19 ENCOUNTER — Encounter: Payer: Self-pay | Admitting: Family Medicine

## 2015-12-27 ENCOUNTER — Other Ambulatory Visit: Payer: Self-pay | Admitting: Physician Assistant

## 2016-04-27 ENCOUNTER — Encounter: Payer: Self-pay | Admitting: Physician Assistant

## 2016-04-27 ENCOUNTER — Ambulatory Visit (INDEPENDENT_AMBULATORY_CARE_PROVIDER_SITE_OTHER): Payer: Medicare Other | Admitting: Physician Assistant

## 2016-04-27 VITALS — BP 130/74 | HR 98 | Temp 98.0°F | Resp 16 | Wt 131.5 lb

## 2016-04-27 DIAGNOSIS — I10 Essential (primary) hypertension: Secondary | ICD-10-CM

## 2016-04-27 DIAGNOSIS — Z86010 Personal history of colonic polyps: Secondary | ICD-10-CM

## 2016-04-27 DIAGNOSIS — M8580 Other specified disorders of bone density and structure, unspecified site: Secondary | ICD-10-CM

## 2016-04-27 DIAGNOSIS — J439 Emphysema, unspecified: Secondary | ICD-10-CM | POA: Diagnosis not present

## 2016-04-27 DIAGNOSIS — M5137 Other intervertebral disc degeneration, lumbosacral region: Secondary | ICD-10-CM

## 2016-04-27 LAB — BASIC METABOLIC PANEL WITH GFR
BUN: 18 mg/dL (ref 7–25)
CALCIUM: 10 mg/dL (ref 8.6–10.4)
CHLORIDE: 103 mmol/L (ref 98–110)
CO2: 27 mmol/L (ref 20–31)
Creat: 0.95 mg/dL — ABNORMAL HIGH (ref 0.60–0.93)
GFR, EST NON AFRICAN AMERICAN: 60 mL/min (ref >=60)
GFR, Est African American: 69 mL/min (ref >=60)
GLUCOSE: 90 mg/dL (ref 70–99)
POTASSIUM: 4.7 mmol/L (ref 3.5–5.3)
Sodium: 141 mmol/L (ref 135–146)

## 2016-04-27 NOTE — Progress Notes (Signed)
Patient ID: BRANDEN FERRAR MRN: [id number], DOB: Aug 23, 1940, 75 y.o. Date of Encounter: @DATE @  Chief Complaint:  Chief Complaint  Patient presents with  . 6 month follow up    HPI: 75 y.o. year old white female  presents for [place].  I saw her as a new patient to this practice on 03/13/13. Prior to that she had not had any medical evaluation for 25 years.  On 03/13/2013 she presented with complaints of pain in the right buttock, radiating down the lateral aspect of the right leg. She subsequently underwent further evaluation and treatment by me and subsequently was referred to Dr. Nelva Bush. Since being seen at his office she has done physical therapy there. As well she says that she has had 1 injection by him. Says that the pain and symptoms have been much better and has not had to require any further injections thus far. Says Dr. Nelva Bush told her he could do surgery.  At [facility] 10/18/14--pt says pain stable. Rarely needs med for pain. When does, just uses Advil.  Has required no further injections.   At her initial visit with me 03/13/13 she was also found to have hypertension. We have gradually titrated her medications to where she is now currently on amlodipine 10 mg daily and benazepril 20 mg daily. She is taking these as directed. No LE edema. No lightheadedness.   She came in and had a complete physical exam with me on 04/12/13 and again 04/24/2015.   Today she states that she has continued to feel very good. She stays active.  At  Prior [facility] she was getting ready for pool party with grandkids.  Says she has 2 sons, 6 grandsons, and finally and great-grand daughter--the first girl!!! Grandkids range from 54 y/o to 86 y/o.   She smoked in the past but quit around 2009. She states that she very rarely needs to use her albuterol inhaler.  She is taking her blood pressure medications as directed and no adverse effects.   No other complaints or concerns. No other updates regarding her medical  conditions over the past 6 months.   Past Medical History:  Diagnosis Date  . COPD (chronic obstructive pulmonary disease) ([place])   . DDD (degenerative disc disease), lumbosacral 07/12/2013  . Hypertension   . Low back pain on right side with sciatica 03/27/2013     Home Meds:  Outpatient Medications Prior to Visit  Medication Sig Dispense Refill  . albuterol (PROVENTIL HFA;VENTOLIN HFA) 108 (90 BASE) MCG/ACT inhaler Inhale 1-2 puffs into the lungs every 4 (four) hours as needed for wheezing or shortness of breath. 1 Inhaler 0  . amLODipine (NORVASC) 10 MG tablet TAKE 1 TABLET (10 MG TOTAL) BY MOUTH DAILY. 90 tablet 1  . benazepril (LOTENSIN) 20 MG tablet TAKE 1 TABLET (20 MG TOTAL) BY MOUTH DAILY. 90 tablet 1  . Calcium Carbonate-Vitamin D (CALCIUM + D PO) Take 1 tablet by mouth 2 (two) times daily with a meal.    . ibuprofen (ADVIL,MOTRIN) 200 MG tablet Take 200 mg by mouth every 6 (six) hours as needed for pain.     No facility-administered medications prior to visit.      Allergies: No Known Allergies  Social History   Social History  . Marital status: Married    Spouse name: N/A  . Number of children: N/A  . Years of education: N/A   Occupational History  . Not on file.   Social History Main Topics  .  Smoking status: Former Smoker    Packs/day: 1.50    Quit date: 04/17/2009  . Smokeless tobacco: Never Used  . Alcohol use No  . Drug use: No  . Sexual activity: Not Currently   Other Topics Concern  . Not on file   Social History Narrative   Married.    2 Children. Both Boys.   First Baby Died at 34 months old of an infection.   After had Third Baby stayed home for 15 years until her boys were grown..     Family History  Problem Relation Age of Onset  . Cancer Mother 8    colon  . Colon cancer Mother   . Heart disease Father   . Hypertension Father   . Heart disease Brother      Review of Systems:  See HPI for pertinent ROS. All other ROS negative.     Physical Exam: Blood pressure 130/74, pulse 98, temperature 98 F (36.7 C), temperature source Oral, resp. rate 16, weight 131 lb 8 oz (59.6 kg)., Body mass index is 24.05 kg/m. General: WNWD WF. Appears in no acute distress. Neck: Supple. No thyromegaly. No lymphadenopathy. No carotid bruit. Lungs: Clear bilaterally to auscultation without wheezes, rales, or rhonchi. Breathing is unlabored. Heart: RRR with S1 S2. No murmurs, rubs, or gallops. Musculoskeletal:  Strength and tone normal for age. Extremities/Skin: Warm and dry.  No edema.  Neuro: Alert and oriented X 3. Moves all extremities spontaneously. Gait is normal. CNII-XII grossly in tact. Psych:  Responds to questions appropriately with a normal affect.     ASSESSMENT AND PLAN:  75 y.o. year old female with    1. Essential hypertension Blood pressure controlled/at goal. Check labs monitor. Continue current medication. - BASIC METABOLIC PANEL WITH GFR  2. Pulmonary emphysema, unspecified emphysema type She quit smoking in approximately 2009 This is controlled and stable. Continue albuterol when necessary.  3. Family history of colon cancer   History of colonic polyps She had colonoscopy 07/03/13. She reports that this did show polyp. She reports that she was told to repeat 5 years.  4. DDD (degenerative disc disease), lumbosacral See HPI. Managed by Dr. Nelva Bush  5. Osteopenia She had bone density test 05/09/2013.  Lumbar T score -1.0.  Left femur T score -1.5.  Consistent with low bone mass.  She started calcium and vitamin D at that time is continued taking this.  Repeat bone density test 2 years.  At [place] 10/23/2015--- I ordered f/u DEXA  DEXA --- 11/12/2015 T-Score - 1.5 Increased bone density lumbar spine compared to prior study.  No change in bone density at hip compared to prior study   THE FOLLOWING IS COPIED FROM HER CPE : PREVENTIVE CARE: A. screening labs.  - CBC with Differential  --------------------------------------------------Normal - COMPLETE METABOLIC PANEL WITH GFR -----------------Normal - Lipid panel ----------------------------------------------------------------Normal.   - TSH ------------------------------------------------------------------------Normal - Vit D 25 hydroxy (rtn osteoporosis monitoring) -----------------Normal  B. At [facility] [place] 03/2013 she reported that she had not had Pap smear in over 20 years.   - PAP, ThinPrep, Imaging, Medicare ---Done 03/2013---Cytology Alone --Negative (No HPV Cotesting) No further Pap smear needed given age 18  C. screening colonoscopy:  At [facility] 03/2013 she said that she had one colonoscopy approximately 20 years ago. She was due for a colonoscopy regardless. However she also does have positive family history she definitely needs to proceed with colonoscopy. Her mom was diagnosed with colon cancer at age 46. She was agreeable to follow  up with this.  I ordered Referral to GI at [facility] 03/2013. She did have colonoscopy---07/03/2013. Per Pt--Revealed 2 polyps. Told to repeat 5 years.  D. screening mammogram:  AT [facility] 03/2013 she reported she had never had one. She was agreeable   The followup office visit she reported that she had this done. Had a second mammogram/ultrasound to confirm. Was negative. Was told to followup one year.  At [place] 10/18/14--pt says she has not called and scheduled f/u--says she will go home and call to schedule.  Last mammogram 10/30/2014 negative At [facility] 10/2015-- i placed order for mammogram and DEXA scan to be performed the same day. 11/12/2015-----Mammogram----Negative  E. screening bone density test:  At [facility] 8/2014She had never had one-- she was agreeable- scheduled bone density  At Followup office visit she reported that she did have the bone density scan performed and stated that she did start calcium and vitamin D as instructed.                                                                                                 Osteopenia She had bone density test 05/09/2013.  Lumbar T score -1.0.  Left femur T score -1.5.  Consistent with low bone mass.  She started calcium and vitamin D at that time is continued taking this.  Repeat bone density test 2 years. AT [facility] 10/2015-- I placed order for DEXA and Mammogram to be performed at same time   DEXA --- 11/12/2015 T-Score - 1.5 Increased bone density lumbar spine compared to prior study.  No change in bone density at hip compared to prior study         F. Vaccines:  Tetanus: Given here 10/16/2013  Pneumococcal: Pneumovax 23 --given here 04/12/2013.                                                                                         Prevnar 13---Given here 04/18/2014  Zostavax: She did check with her insurance regarding the cost of this. $95. Pt defers.     She will need to followup for routine followup visit in 6 months. Followup sooner if needed.    [address] [place], [place], [facility] 04/27/2016 8:36 AM

## 2016-07-15 ENCOUNTER — Other Ambulatory Visit: Payer: Self-pay | Admitting: Physician Assistant

## 2016-07-15 NOTE — Telephone Encounter (Signed)
Medication refilled per protocol. 

## 2016-10-26 ENCOUNTER — Encounter: Payer: Self-pay | Admitting: Physician Assistant

## 2016-10-26 ENCOUNTER — Ambulatory Visit (INDEPENDENT_AMBULATORY_CARE_PROVIDER_SITE_OTHER): Payer: Medicare Other | Admitting: Physician Assistant

## 2016-10-26 VITALS — BP 130/62 | HR 76 | Temp 98.2°F | Resp 16 | Wt 131.2 lb

## 2016-10-26 DIAGNOSIS — J439 Emphysema, unspecified: Secondary | ICD-10-CM | POA: Diagnosis not present

## 2016-10-26 DIAGNOSIS — M8580 Other specified disorders of bone density and structure, unspecified site: Secondary | ICD-10-CM | POA: Diagnosis not present

## 2016-10-26 DIAGNOSIS — I10 Essential (primary) hypertension: Secondary | ICD-10-CM | POA: Diagnosis not present

## 2016-10-26 LAB — BASIC METABOLIC PANEL WITH GFR
BUN: 18 mg/dL (ref 7–25)
CHLORIDE: 104 mmol/L (ref 98–110)
CO2: 27 mmol/L (ref 20–31)
Calcium: 10.2 mg/dL (ref 8.6–10.4)
Creat: 1.03 mg/dL — ABNORMAL HIGH (ref 0.60–0.93)
GFR, Est African American: 62 mL/min (ref >=60)
GFR, Est Non African American: 54 mL/min — ABNORMAL LOW (ref >=60)
Glucose, Bld: 97 mg/dL (ref 70–99)
POTASSIUM: 4.1 mmol/L (ref 3.5–5.3)
SODIUM: 141 mmol/L (ref 135–146)

## 2016-10-26 MED ORDER — AMLODIPINE BESYLATE 10 MG PO TABS: ORAL_TABLET | ORAL | 1 refills | Status: DC

## 2016-10-26 MED ORDER — BENAZEPRIL HCL 20 MG PO TABS: ORAL_TABLET | ORAL | 1 refills | Status: DC

## 2016-10-26 NOTE — Progress Notes (Signed)
Patient ID: Kristina Lucas MRN: [id number], DOB: 22-Sep-1940, 76 y.o. Date of Encounter: @DATE @  Chief Complaint:  Chief Complaint  Patient presents with  . Hypertension    6 month follow up     HPI: 76 y.o. year old white female  presents for [facility] CPE.  I saw her as a new patient to this practice on 03/13/13. Prior to that she had not had any medical evaluation for 25 years.  On 03/13/2013 she presented with complaints of pain in the right buttock, radiating down the lateral aspect of the right leg. She subsequently underwent further evaluation and treatment by me and subsequently was referred to Dr. Nelva Bush. Since being seen at his office she has done physical therapy there. As well she says that she has had 1 injection by him. Says that the pain and symptoms have been much better and has not had to require any further injections thus far. Says Dr. Nelva Bush told her he could do surgery.  At [facility] 10/18/14--pt says pain stable. Rarely needs med for pain. When does, just uses Advil.  Has required no further injections.   At her initial visit with me 03/13/13 she was also found to have hypertension. We have gradually titrated her medications to where she is now currently on amlodipine 10 mg daily and benazepril 20 mg daily. She is taking these as directed. No LE edema. No lightheadedness.   She came in and had a complete physical exam with me on 04/12/13 and again 04/24/2015.   04/27/2016: she states that she has continued to feel very good. She stays active.  At  Prior OV she was getting ready for pool party with grandkids.  Says she has 2 sons, 6 grandsons, and finally and great-grand daughter--the first girl!!! Grandkids range from 25 y/o to 56 y/o.   She smoked in the past but quit around 2009. She states that she very rarely needs to use her albuterol inhaler.  She is taking her blood pressure medications as directed and no adverse effects.   10/26/2016: She has no complaints or concerns to  address today. Is just here for her 6 month follow-up visit to follow-up hypertension. Taking blood pressure medicines as directed with no adverse effects. She states that she rarely feels any wheezing. Sometimes it will just resolve on its own. Sometimes she will use her albuterol inhaler. Says that the wheezing quickly resolves and she has not been having any issues with that and does not feel that we need to add additional medicines for COPD.    Past Medical History:  Diagnosis Date  . COPD (chronic obstructive pulmonary disease) ([place])   . DDD (degenerative disc disease), lumbosacral 07/12/2013  . Hypertension   . Low back pain on right side with sciatica 03/27/2013     Home Meds:  Outpatient Medications Prior to Visit  Medication Sig Dispense Refill  . albuterol (PROVENTIL HFA;VENTOLIN HFA) 108 (90 BASE) MCG/ACT inhaler Inhale 1-2 puffs into the lungs every 4 (four) hours as needed for wheezing or shortness of breath. 1 Inhaler 0  . amLODipine (NORVASC) 10 MG tablet TAKE 1 TABLET (10 MG TOTAL) BY MOUTH DAILY. 90 tablet 1  . benazepril (LOTENSIN) 20 MG tablet TAKE 1 TABLET (20 MG TOTAL) BY MOUTH DAILY. 90 tablet 1  . Calcium Carbonate-Vitamin D (CALCIUM + D PO) Take 1 tablet by mouth 2 (two) times daily with a meal.    . ibuprofen (ADVIL,MOTRIN) 200 MG tablet Take 200 mg by  mouth every 6 (six) hours as needed for pain.     No facility-administered medications prior to visit.      Allergies: No Known Allergies  Social History   Social History  . Marital status: Married    Spouse name: N/A  . Number of children: N/A  . Years of education: N/A   Occupational History  . Not on file.   Social History Main Topics  . Smoking status: Former Smoker    Packs/day: 1.50    Quit date: 04/17/2009  . Smokeless tobacco: Never Used  . Alcohol use No  . Drug use: No  . Sexual activity: Not Currently   Other Topics Concern  . Not on file   Social History Narrative   Married.    2  Children. Both Boys.   First Baby Died at 76 months old of an infection.   After had Third Baby stayed home for 15 years until her boys were grown..     Family History  Problem Relation Age of Onset  . Cancer Mother 70    colon  . Colon cancer Mother   . Heart disease Father   . Hypertension Father   . Heart disease Brother      Review of Systems:  See HPI for pertinent ROS. All other ROS negative.    Physical Exam: Blood pressure 130/62, pulse 76, temperature 98.2 F (36.8 C), temperature source Oral, resp. rate 16, weight 131 lb 3.2 oz (59.5 kg), SpO2 97 %., Body mass index is 24 kg/m. General: WNWD WF. Appears in no acute distress. Neck: Supple. No thyromegaly. No lymphadenopathy. No carotid bruit. Lungs: Clear bilaterally to auscultation without wheezes, rales, or rhonchi. Breathing is unlabored. Heart: RRR with S1 S2. No murmurs, rubs, or gallops. Musculoskeletal:  Strength and tone normal for age. Extremities/Skin: Warm and dry.  No edema.  Neuro: Alert and oriented X 3. Moves all extremities spontaneously. Gait is normal. CNII-XII grossly in tact. Psych:  Responds to questions appropriately with a normal affect.     ASSESSMENT AND PLAN:  76 y.o. year old female with    1. Essential hypertension 10/26/2016:Blood pressure controlled/at goal. Check labs monitor. Continue current medication.  - BASIC METABOLIC PANEL WITH GFR  2. Pulmonary emphysema, unspecified emphysema type 10/26/2016:She quit smoking in approximately 2009 10/26/2016:This is controlled and stable. Continue albuterol when necessary.  3. Family history of colon cancer   History of colonic polyps She had colonoscopy 07/03/13. She reports that this did show polyp. She reports that she was told to repeat 5 years.  4. DDD (degenerative disc disease), lumbosacral See HPI. Managed by Dr. Nelva Bush  5. Osteopenia She had bone density test 05/09/2013.  Lumbar T score -1.0.  Left femur T score -1.5.    Consistent with low bone mass.  She started calcium and vitamin D at that time is continued taking this.  Repeat bone density test 2 years.  At [place] 10/23/2015--- I ordered f/u DEXA  DEXA --- 11/12/2015 T-Score - 1.5 Increased bone density lumbar spine compared to prior study.  No change in bone density at hip compared to prior study   THE FOLLOWING IS COPIED FROM HER CPE : PREVENTIVE CARE: A. screening labs.  - CBC with Differential --------------------------------------------------Normal - COMPLETE METABOLIC PANEL WITH GFR -----------------Normal - Lipid panel ----------------------------------------------------------------Normal.   - TSH ------------------------------------------------------------------------Normal - Vit D 25 hydroxy (rtn osteoporosis monitoring) -----------------Normal  B. At [place] 03/2013 she reported that she had not had Pap smear in over  20 years.   - PAP, ThinPrep, Imaging, Medicare ---Done 03/2013---Cytology Alone --Negative (No HPV Cotesting) No further Pap smear needed given age 30  C. screening colonoscopy:  At [place] 03/2013 she said that she had one colonoscopy approximately 20 years ago. She was due for a colonoscopy regardless. However she also does have positive family history she definitely needs to proceed with colonoscopy. Her mom was diagnosed with colon cancer at age 81. She was agreeable to follow up with this.  I ordered Referral to GI at [place] 03/2013. She did have colonoscopy---07/03/2013. Per Pt--Revealed 2 polyps. Told to repeat 5 years.  D. screening mammogram:  AT [facility] 03/2013 she reported she had never had one. She was agreeable   The followup office visit she reported that she had this done. Had a second mammogram/ultrasound to confirm. Was negative. Was told to followup one year.  At [place] 10/18/14--pt says she has not called and scheduled f/u--says she will go home and call to schedule.  Last mammogram 10/30/2014 negative At [place] 10/2015-- i placed order for  mammogram and DEXA scan to be performed the same day. 11/12/2015-----Mammogram----Negative  E. screening bone density test:  At [facility] 8/2014She had never had one-- she was agreeable- scheduled bone density  At Followup office visit she reported that she did have the bone density scan performed and stated that she did start calcium and vitamin D as instructed.                                                                                                Osteopenia She had bone density test 05/09/2013.  Lumbar T score -1.0.  Left femur T score -1.5.  Consistent with low bone mass.  She started calcium and vitamin D at that time is continued taking this.  Repeat bone density test 2 years. AT [facility] 10/2015-- I placed order for DEXA and Mammogram to be performed at same time   DEXA --- 11/12/2015 T-Score - 1.5 Increased bone density lumbar spine compared to prior study.  No change in bone density at hip compared to prior study         F. Vaccines:  Tetanus: Given here 10/16/2013  Pneumococcal: Pneumovax 23 --given here 04/12/2013.                                                                                         Prevnar 13---Given here 04/18/2014  Zostavax: She did check with her insurance regarding the cost of this. $95. Pt defers.     She will need to followup for routine followup visit in 6 months. Followup sooner if needed.    [address] [place], [place], [facility] 10/26/2016 11:32 AM

## 2016-11-05 ENCOUNTER — Encounter: Payer: Self-pay | Admitting: Physician Assistant

## 2016-11-05 ENCOUNTER — Ambulatory Visit (INDEPENDENT_AMBULATORY_CARE_PROVIDER_SITE_OTHER): Payer: Medicare Other | Admitting: Physician Assistant

## 2016-11-05 VITALS — BP 118/62 | HR 113 | Temp 99.6°F | Resp 18 | Wt 127.0 lb

## 2016-11-05 DIAGNOSIS — J988 Other specified respiratory disorders: Secondary | ICD-10-CM

## 2016-11-05 DIAGNOSIS — B9689 Other specified bacterial agents as the cause of diseases classified elsewhere: Principal | ICD-10-CM

## 2016-11-05 DIAGNOSIS — R0789 Other chest pain: Secondary | ICD-10-CM | POA: Diagnosis not present

## 2016-11-05 MED ORDER — AZITHROMYCIN 250 MG PO TABS: ORAL_TABLET | ORAL | 0 refills | Status: DC

## 2016-11-05 MED ORDER — ALBUTEROL SULFATE HFA 108 (90 BASE) MCG/ACT IN AERS: 1.0000 | INHALATION_SPRAY | RESPIRATORY_TRACT | 0 refills | Status: DC | PRN

## 2016-11-05 NOTE — Progress Notes (Signed)
Patient ID: TIASIA WEBERG MRN: [id number], DOB: [birth year], 76 y.o. Date of Encounter: 11/05/2016, 12:25 PM    Chief Complaint:  Chief Complaint  Patient presents with  . chest congestion    x1wk     HPI: 76 y.o. year old female presents with above.   She states that she has been having congestion for a week. Some drainage out of her nose but is having a lot of cough and chest congestion. Says that she is feeling increased fatigue and sleepy says feels like she can hardly keep her eyes open over the past week. No sore throat. No fevers or chills. Using some Mucinex and another over-the-counter medicine with minimal relief. Sometimes if she coughs a lot she will use her albuterol inhaler and the coughing will resolve but she is requesting a new inhaler because she thinks the current one is running out and is expired. No other wheezing except for these coughing episodes described above.     Home Meds:   Outpatient Medications Prior to Visit  Medication Sig Dispense Refill  . amLODipine (NORVASC) 10 MG tablet TAKE 1 TABLET (10 MG TOTAL) BY MOUTH DAILY. 90 tablet 1  . benazepril (LOTENSIN) 20 MG tablet TAKE 1 TABLET (20 MG TOTAL) BY MOUTH DAILY. 90 tablet 1  . Calcium Carbonate-Vitamin D (CALCIUM + D PO) Take 1 tablet by mouth 2 (two) times daily with a meal.    . ibuprofen (ADVIL,MOTRIN) 200 MG tablet Take 200 mg by mouth every 6 (six) hours as needed for pain.    Marland Kitchen albuterol (PROVENTIL HFA;VENTOLIN HFA) 108 (90 BASE) MCG/ACT inhaler Inhale 1-2 puffs into the lungs every 4 (four) hours as needed for wheezing or shortness of breath. 1 Inhaler 0   No facility-administered medications prior to visit.     Allergies: No Known Allergies    Review of Systems: See HPI for pertinent ROS. All other ROS negative.    Physical Exam: Blood pressure 118/62, pulse (!) 113, temperature 99.6 F (37.6 C), temperature source Oral, resp. rate 18, weight 127 lb (57.6 kg), SpO2 98 %., Body mass  index is 23.23 kg/m. General:  WNWD WF. Appears in no acute distress. HEENT: Normocephalic, atraumatic, eyes without discharge, sclera non-icteric, nares are without discharge. Bilateral auditory canals clear, TM's are without perforation, pearly grey and translucent with reflective cone of light bilaterally. Oral cavity moist, posterior pharynx without exudate, erythema, peritonsillar abscess. No tenderness with percussion to frontal or maxillary sinuses bilaterally.  Neck: Supple. No thyromegaly. No lymphadenopathy. Lungs: Clear bilaterally to auscultation without wheezes, rales, or rhonchi. Breathing is unlabored. Heart: Regular rhythm. No murmurs, rubs, or gallops. Msk:  Strength and tone normal for age. Extremities/Skin: Warm and dry.  Neuro: Alert and oriented X 3. Moves all extremities spontaneously. Gait is normal. CNII-XII grossly in tact. Psych:  Responds to questions appropriately with a normal affect.     ASSESSMENT AND PLAN:  76 y.o. year old female with  1. Bacterial respiratory infection She is to start the antibiotic immediately and take as directed. Mucinex DM as expectorant. Albuterol 2 puffs every 4-6 hours as needed for wheezing. Follow up if symptoms worsen or are not resolved wihin one week after completion oaf antibiotic. - azithromycin (ZITHROMAX) 250 MG tablet; Day 1: Take 2 daily. Days 2 - 5: Take 1 daily.  Dispense: 6 tablet; Refill: 0 - albuterol (PROVENTIL HFA;VENTOLIN HFA) 108 (90 Base) MCG/ACT inhaler; Inhale 1-2 puffs into the lungs every 4 (four) hours as  needed for wheezing or shortness of breath.  Dispense: 1 Inhaler; Refill: 0   Signed, [address] [place], [place], [facility] 11/05/2016 12:25 PM

## 2017-04-28 ENCOUNTER — Encounter: Payer: Self-pay | Admitting: Physician Assistant

## 2017-04-28 ENCOUNTER — Ambulatory Visit (INDEPENDENT_AMBULATORY_CARE_PROVIDER_SITE_OTHER): Payer: Medicare Other | Admitting: Physician Assistant

## 2017-04-28 VITALS — BP 134/68 | HR 95 | Temp 98.1°F | Resp 14 | Ht 62.0 in | Wt 126.0 lb

## 2017-04-28 DIAGNOSIS — M8580 Other specified disorders of bone density and structure, unspecified site: Secondary | ICD-10-CM | POA: Diagnosis not present

## 2017-04-28 DIAGNOSIS — Z86010 Personal history of colonic polyps: Secondary | ICD-10-CM

## 2017-04-28 DIAGNOSIS — I10 Essential (primary) hypertension: Secondary | ICD-10-CM

## 2017-04-28 LAB — BASIC METABOLIC PANEL WITH GFR
BUN/Creatinine Ratio: 15 (calc) (ref 6–22)
BUN: 15 mg/dL (ref 7–25)
CHLORIDE: 107 mmol/L (ref 98–110)
CO2: 24 mmol/L (ref 20–32)
Calcium: 9.5 mg/dL (ref 8.6–10.4)
Creat: 0.99 mg/dL — ABNORMAL HIGH (ref 0.60–0.93)
GFR, Est African American: 66 mL/min/{1.73_m2} (ref >=60)
GFR, Est Non African American: 57 mL/min/{1.73_m2} — ABNORMAL LOW (ref >=60)
Glucose, Bld: 98 mg/dL (ref 65–99)
POTASSIUM: 4.3 mmol/L (ref 3.5–5.3)
SODIUM: 140 mmol/L (ref 135–146)

## 2017-04-28 NOTE — Progress Notes (Signed)
Patient ID: Kristina Lucas MRN: [id number], DOB: 12-30-1940, 76 y.o. Date of Encounter: @DATE @  Chief Complaint:  Chief Complaint  Patient presents with  . Medication Management  . Medication Refill    HPI: 76 y.o. year old white female  presents for [facility] CPE.  I saw her as a new patient to this practice on 03/13/13. Prior to that she had not had any medical evaluation for 25 years.  On 03/13/2013 she presented with complaints of pain in the right buttock, radiating down the lateral aspect of the right leg. She subsequently underwent further evaluation and treatment by me and subsequently was referred to Dr. Nelva Bush. Since being seen at his office she has done physical therapy there. As well she says that she has had 1 injection by him. Says that the pain and symptoms have been much better and has not had to require any further injections thus far. Says Dr. Nelva Bush told her he could do surgery.  At [facility] 10/18/14--pt says pain stable. Rarely needs med for pain. When does, just uses Advil.  Has required no further injections.   At her initial visit with me 03/13/13 she was also found to have hypertension. We have gradually titrated her medications to where she is now currently on amlodipine 10 mg daily and benazepril 20 mg daily. She is taking these as directed. No LE edema. No lightheadedness.   She came in and had a complete physical exam with me on 04/12/13 and again 04/24/2015.   04/27/2016: she states that she has continued to feel very good. She stays active.  At  Prior OV she was getting ready for pool party with grandkids.  Says she has 2 sons, 6 grandsons, and finally and great-grand daughter--the first girl!!! Grandkids range from 110 y/o to 88 y/o.   She smoked in the past but quit around 2009. She states that she very rarely needs to use her albuterol inhaler.  She is taking her blood pressure medications as directed and no adverse effects.   10/26/2016: She has no complaints or  concerns to address today. Is just here for her 6 month follow-up visit to follow-up hypertension. Taking blood pressure medicines as directed with no adverse effects. She states that she rarely feels any wheezing. Sometimes it will just resolve on its own. Sometimes she will use her albuterol inhaler. Says that the wheezing quickly resolves and she has not been having any issues with that and does not feel that we need to add additional medicines for COPD.   04/28/2017:  She states that she has been feeling pretty good and has no specific concerns to address. She does talk about how busy she is--- says that she has never been one to be able to sit still can't watch TV always has to be doing something--- also recently has been taking family member to chemotherapy multiple times per week, getting grand son to and from school, doing things at church, etc She is taking blood pressure medications as directed. No adverse effects. Has had no problems with wheezing recently. Has not needed albuterol inhaler much at all.    Past Medical History:  Diagnosis Date  . COPD (chronic obstructive pulmonary disease) ([place])   . DDD (degenerative disc disease), lumbosacral 07/12/2013  . Hypertension   . Low back pain on right side with sciatica 03/27/2013     Home Meds:  Outpatient Medications Prior to Visit  Medication Sig Dispense Refill  . albuterol (PROVENTIL HFA;VENTOLIN HFA)  108 (90 Base) MCG/ACT inhaler Inhale 1-2 puffs into the lungs every 4 (four) hours as needed for wheezing or shortness of breath. 1 Inhaler 0  . amLODipine (NORVASC) 10 MG tablet TAKE 1 TABLET (10 MG TOTAL) BY MOUTH DAILY. 90 tablet 1  . benazepril (LOTENSIN) 20 MG tablet TAKE 1 TABLET (20 MG TOTAL) BY MOUTH DAILY. 90 tablet 1  . Calcium Carbonate-Vitamin D (CALCIUM + D PO) Take 1 tablet by mouth 2 (two) times daily with a meal.    . ibuprofen (ADVIL,MOTRIN) 200 MG tablet Take 200 mg by mouth every 6 (six) hours as needed for pain.     Marland Kitchen azithromycin (ZITHROMAX) 250 MG tablet Day 1: Take 2 daily. Days 2 - 5: Take 1 daily. 6 tablet 0   No facility-administered medications prior to visit.      Allergies: No Known Allergies  Social History   Social History  . Marital status: Married    Spouse name: N/A  . Number of children: N/A  . Years of education: N/A   Occupational History  . Not on file.   Social History Main Topics  . Smoking status: Former Smoker    Packs/day: 1.50    Quit date: 04/17/2009  . Smokeless tobacco: Never Used  . Alcohol use No  . Drug use: No  . Sexual activity: Not Currently   Other Topics Concern  . Not on file   Social History Narrative   Married.    2 Children. Both Boys.   First Baby Died at 64 months old of an infection.   After had Third Baby stayed home for 15 years until her boys were grown..     Family History  Problem Relation Age of Onset  . Cancer Mother 52       colon  . Colon cancer Mother   . Heart disease Father   . Hypertension Father   . Heart disease Brother      Review of Systems:  See HPI for pertinent ROS. All other ROS negative.    Physical Exam: Blood pressure 134/68, pulse 95, temperature 98.1 F (36.7 C), temperature source Oral, resp. rate 14, height 5\' 2"  (1.575 m), weight 126 lb (57.2 kg), SpO2 98 %., Body mass index is 23.05 kg/m. General: WNWD WF. Appears in no acute distress. Neck: Supple. No thyromegaly. No lymphadenopathy. No carotid bruit. Lungs: Clear bilaterally to auscultation without wheezes, rales, or rhonchi. Breathing is unlabored. Heart: RRR with S1 S2. No murmurs, rubs, or gallops. Musculoskeletal:  Strength and tone normal for age. Extremities/Skin: Warm and dry.  No LE edema.  Neuro: Alert and oriented X 3. Moves all extremities spontaneously. Gait is normal. CNII-XII grossly in tact. Psych:  Responds to questions appropriately with a normal affect.     ASSESSMENT AND PLAN:  76 y.o. year old female with    1.  Essential hypertension 04/28/2017:Blood pressure controlled/at goal. Check labs monitor. Continue current medication.  - BASIC METABOLIC PANEL WITH GFR  2. Pulmonary emphysema, unspecified emphysema type 04/28/2017:She quit smoking in approximately 2009 04/28/2017:This is controlled and stable. Continue albuterol when necessary.  3. Family history of colon cancer   History of colonic polyps 04/28/2017: She had colonoscopy 07/03/13. She reports that this did show polyp. She reports that she was told to repeat 5 years. Follow-up due 07/03/2018  4. DDD (degenerative disc disease), lumbosacral 04/28/2017: See HPI. Managed by Dr. Nelva Bush  5. Osteopenia She had bone density test 05/09/2013.  Lumbar T score -1.0.  Left femur T score -1.5.  Consistent with low bone mass.  She started calcium and vitamin D at that time is continued taking this.  Repeat bone density test 2 years.  DEXA --- 11/12/2015 T-Score - 1.5 Increased bone density lumbar spine compared to prior study.  No change in bone density at hip compared to prior study  04/28/2017: Above DEXA scan information reviewed. She is continuing calcium and vitamin D and weightbearing exercise.   THE FOLLOWING IS COPIED FROM HER CPE : PREVENTIVE CARE: A. screening labs.  - CBC with Differential --------------------------------------------------Normal - COMPLETE METABOLIC PANEL WITH GFR -----------------Normal - Lipid panel ----------------------------------------------------------------Normal.   - TSH ------------------------------------------------------------------------Normal - Vit D 25 hydroxy (rtn osteoporosis monitoring) -----------------Normal  B. At [place] 03/2013 she reported that she had not had Pap smear in over 20 years.   - PAP, ThinPrep, Imaging, Medicare ---Done 03/2013---Cytology Alone --Negative (No HPV Cotesting) No further Pap smear needed given age 41  C. screening colonoscopy:  At [place] 03/2013 she said that she had one  colonoscopy approximately 20 years ago. She was due for a colonoscopy regardless. However she also does have positive family history she definitely needs to proceed with colonoscopy. Her mom was diagnosed with colon cancer at age 68. She was agreeable to follow up with this.  I ordered Referral to GI at [place] 03/2013. She did have colonoscopy---07/03/2013. Per Pt--Revealed 2 polyps. Told to repeat 5 years. Repeat due 07/03/2018  D. screening mammogram:  AT [place] 03/2013 she reported she had never had one. She was agreeable   The followup office visit she reported that she had this done. Had a second mammogram/ultrasound to confirm. Was negative. Was told to followup one year.  At [place] 10/18/14--pt says she has not called and scheduled f/u--says she will go home and call to schedule.  Last mammogram 10/30/2014 negative At [place] 10/2015-- i placed order for mammogram and DEXA scan to be performed the same day. 11/12/2015-----Mammogram----Negative 04/28/2017: Discussed having her schedule follow-up mammogram.  E. screening bone density test:  At [place] 8/2014She had never had one-- she was agreeable- scheduled bone density  At Followup office visit she reported that she did have the bone density scan performed and stated that she did start calcium and vitamin D as instructed.                                                                                                Osteopenia She had bone density test 05/09/2013.  Lumbar T score -1.0.  Left femur T score -1.5.  Consistent with low bone mass.  She started calcium and vitamin D at that time is continued taking this.  Repeat bone density test 2 years. AT [facility] 10/2015-- I placed order for DEXA and Mammogram to be performed at same time   DEXA --- 11/12/2015 T-Score - 1.5 Increased bone density lumbar spine compared to prior study.  No change in bone density at hip compared to prior study         F. Vaccines:  Tetanus: Given here 10/16/2013  Pneumococcal: Pneumovax 23  --given here 04/12/2013.  Prevnar 13---Given here 04/18/2014  Zostavax: She did check with her insurance regarding the cost of this. $95. Pt defers.   Flu Vaccine--04/28/2017: She reports that she is going to get the high dose flu vaccine at the pharmacy.    She will need to followup for routine followup visit in 6 months. Followup sooner if needed.    [address] [place], [place], [facility] 04/28/2017 10:32 AM

## 2017-10-27 ENCOUNTER — Ambulatory Visit (INDEPENDENT_AMBULATORY_CARE_PROVIDER_SITE_OTHER): Payer: PPO | Admitting: Physician Assistant

## 2017-10-27 ENCOUNTER — Encounter: Payer: Self-pay | Admitting: Physician Assistant

## 2017-10-27 ENCOUNTER — Other Ambulatory Visit: Payer: Self-pay

## 2017-10-27 VITALS — BP 130/62 | HR 87 | Temp 98.2°F | Resp 14 | Ht 62.0 in | Wt 118.4 lb

## 2017-10-27 DIAGNOSIS — Z860100 Personal history of colon polyps, unspecified: Secondary | ICD-10-CM

## 2017-10-27 DIAGNOSIS — J439 Emphysema, unspecified: Secondary | ICD-10-CM | POA: Diagnosis not present

## 2017-10-27 DIAGNOSIS — M8580 Other specified disorders of bone density and structure, unspecified site: Secondary | ICD-10-CM | POA: Diagnosis not present

## 2017-10-27 DIAGNOSIS — K6289 Other specified diseases of anus and rectum: Secondary | ICD-10-CM | POA: Diagnosis not present

## 2017-10-27 DIAGNOSIS — I10 Essential (primary) hypertension: Secondary | ICD-10-CM | POA: Diagnosis not present

## 2017-10-27 DIAGNOSIS — Z800 Family history of malignant neoplasm of digestive organs: Secondary | ICD-10-CM

## 2017-10-27 LAB — BASIC METABOLIC PANEL WITH GFR
BUN/Creatinine Ratio: 14 (calc) (ref 6–22)
BUN: 16 mg/dL (ref 7–25)
CHLORIDE: 102 mmol/L (ref 98–110)
CO2: 27 mmol/L (ref 20–32)
Calcium: 10.4 mg/dL (ref 8.6–10.4)
Creat: 1.14 mg/dL — ABNORMAL HIGH (ref 0.60–0.93)
GFR, Est African American: 55 mL/min/{1.73_m2} — ABNORMAL LOW (ref >=60)
GFR, Est Non African American: 47 mL/min/{1.73_m2} — ABNORMAL LOW (ref >=60)
GLUCOSE: 93 mg/dL (ref 65–99)
Potassium: 4.7 mmol/L (ref 3.5–5.3)
SODIUM: 140 mmol/L (ref 135–146)

## 2017-10-27 LAB — EXTRA LAV TOP TUBE

## 2017-10-27 MED ORDER — HYDROCORTISONE 2.5 % RE CREA: TOPICAL_CREAM | RECTAL | 1 refills | Status: AC

## 2017-10-27 NOTE — Progress Notes (Signed)
Patient ID: Kristina Lucas MRN: [id number], DOB: [birth year], 77 y.o. Date of Encounter: @DATE @  Chief Complaint:  Chief Complaint  Patient presents with  . 6 month follow up    HPI: 77 y.o. year old white female  presents for 6 month f/u OV.  I saw her as a new patient to this practice on 03/13/13. Prior to that she had not had any medical evaluation for 25 years.  On 03/13/2013 she presented with complaints of pain in the right buttock, radiating down the lateral aspect of the right leg. She subsequently underwent further evaluation and treatment by me and subsequently was referred to Dr. Nelva Bush. Since being seen at his office she has done physical therapy there. As well she says that she has had 1 injection by him. Says that the pain and symptoms have been much better and has not had to require any further injections thus far. Says Dr. Nelva Bush told her he could do surgery.  At [facility] 10/18/14--pt says pain stable. Rarely needs med for pain. When does, just uses Advil.  Has required no further injections.   At her initial visit with me 03/13/13 she was also found to have hypertension. We have gradually titrated her medications to where she is now currently on amlodipine 10 mg daily and benazepril 20 mg daily. She is taking these as directed. No LE edema. No lightheadedness.   She came in and had a complete physical exam with me on 04/12/13 and again 04/24/2015.   04/27/2016: she states that she has continued to feel very good. She stays active.  At  Prior OV she was getting ready for pool party with grandkids.  Says she has 2 sons, 6 grandsons, and finally and great-grand daughter--the first girl!!! Grandkids range from 33 y/o to 87 y/o.   She smoked in the past but quit around 2009. She states that she very rarely needs to use her albuterol inhaler.  She is taking her blood pressure medications as directed and no adverse effects.   10/26/2016: She has no complaints or concerns to address  today. Is just here for her 6 month follow-up visit to follow-up hypertension. Taking blood pressure medicines as directed with no adverse effects. She states that she rarely feels any wheezing. Sometimes it will just resolve on its own. Sometimes she will use her albuterol inhaler. Says that the wheezing quickly resolves and she has not been having any issues with that and does not feel that we need to add additional medicines for COPD.   04/28/2017:  She states that she has been feeling pretty good and has no specific concerns to address. She does talk about how busy she is--- says that she has never been one to be able to sit still can't watch TV always has to be doing something--- also recently has been taking family member to chemotherapy multiple times per week, getting grand son to and from school, doing things at church, etc She is taking blood pressure medications as directed. No adverse effects. Has had no problems with wheezing recently. Has not needed albuterol inhaler much at all.   10/27/2017: She reports that she has recently been feeling something on her rectal area.  Says "it feels like a little piece of toilet paper is stuck there or something ".  Says  "I would not worry about it except my mom had colon cancer."  She reports that she has had no itching there.  Says that at times it  does have a burning sensation a little bit.  Has had no type of pain otherwise.  Has seen no melena or hematochezia.  Says that she has no other specific concerns to address today. Continues to take blood pressure medication as directed.  Is having no lightheadedness or other adverse effects. Continues to report that her breathing is stable and is having no problems with wheezing.  Has not been using needing to use the albuterol inhaler.    Past Medical History:  Diagnosis Date  . COPD (chronic obstructive pulmonary disease) ([place])   . DDD (degenerative disc disease), lumbosacral 07/12/2013  .  Hypertension   . Low back pain on right side with sciatica 03/27/2013     Home Meds:  Outpatient Medications Prior to Visit  Medication Sig Dispense Refill  . albuterol (PROVENTIL HFA;VENTOLIN HFA) 108 (90 Base) MCG/ACT inhaler Inhale 1-2 puffs into the lungs every 4 (four) hours as needed for wheezing or shortness of breath. 1 Inhaler 0  . amLODipine (NORVASC) 10 MG tablet TAKE 1 TABLET (10 MG TOTAL) BY MOUTH DAILY. 90 tablet 1  . benazepril (LOTENSIN) 20 MG tablet TAKE 1 TABLET (20 MG TOTAL) BY MOUTH DAILY. 90 tablet 1  . Calcium Carbonate-Vitamin D (CALCIUM + D PO) Take 1 tablet by mouth 2 (two) times daily with a meal.    . ibuprofen (ADVIL,MOTRIN) 200 MG tablet Take 200 mg by mouth every 6 (six) hours as needed for pain.     No facility-administered medications prior to visit.      Allergies: No Known Allergies  Social History   Socioeconomic History  . Marital status: Married    Spouse name: Not on file  . Number of children: Not on file  . Years of education: Not on file  . Highest education level: Not on file  Social Needs  . Financial resource strain: Not on file  . Food insecurity - worry: Not on file  . Food insecurity - inability: Not on file  . Transportation needs - medical: Not on file  . Transportation needs - non-medical: Not on file  Occupational History  . Not on file  Tobacco Use  . Smoking status: Former Smoker    Packs/day: 1.50    Last attempt to quit: 04/17/2009    Years since quitting: 8.5  . Smokeless tobacco: Never Used  Substance and Sexual Activity  . Alcohol use: No  . Drug use: No  . Sexual activity: Not Currently  Other Topics Concern  . Not on file  Social History Narrative   Married.    2 Children. Both Boys.   First Baby Died at 24 months old of an infection.   After had Third Baby stayed home for 15 years until her boys were grown..     Family History  Problem Relation Age of Onset  . Cancer Mother 61       colon  . Colon  cancer Mother   . Heart disease Father   . Hypertension Father   . Heart disease Brother      Review of Systems:  See HPI for pertinent ROS. All other ROS negative.    Physical Exam: Blood pressure 130/62, pulse 87, temperature 98.2 F (36.8 C), temperature source Oral, resp. rate 14, height 5\' 2"  (1.575 m), weight 53.7 kg (118 lb 6.4 oz), SpO2 96 %., Body mass index is 21.66 kg/m. General: WNWD WF. Appears in no acute distress. Neck: Supple. No thyromegaly. No lymphadenopathy. No carotid bruit. Lungs:  Clear bilaterally to auscultation without wheezes, rales, or rhonchi. Breathing is unlabored. Heart: RRR with S1 S2. No murmurs, rubs, or gallops. Musculoskeletal:  Strength and tone normal for age. Extremities/Skin: Warm and dry.  No LE edema.  Anoscopy: I see no true hemorrhoids but do see veins at surface.No fissure, fistula, mass visualized. Neuro: Alert and oriented X 3. Moves all extremities spontaneously. Gait is normal. CNII-XII grossly in tact. Psych:  Responds to questions appropriately with a normal affect.     ASSESSMENT AND PLAN:  77 y.o. year old female with    Essential hypertension 10/27/2017:  Blood pressure controlled/at goal. Check labs monitor. Continue current medication.  - BASIC METABOLIC PANEL WITH GFR  Pulmonary emphysema, unspecified emphysema type 10/27/2017:  She quit smoking in approximately 2009 10/27/2017:  This is controlled and stable. Continue albuterol when necessary.  Rectal irritation Reviewed her colonoscopy report from 07/03/2013.  This showed 3 polyps.  Report stated the timing of follow-up was pending pathology report but then I see no further information after that.  Not sure whether was to repeat in 3 years or 5 years.  Reviewed that patient's mother was diagnosed with colon cancer at age 24 and died at age 61.  Can have been reporting to me that she was told to follow-up 5 years so we had been going off information this would be due  07/03/2018.   Given above information will go ahead and refer back to Dr. Hilarie Fredrickson to see if another colonoscopy is indicated at this time.  In the iterim she can use the An[facility] for symptom relief. - Ambulatory referral to Gastroenterology - hydrocortisone (ANUSOL-HC) 2.5 % rectal cream; Apply rectally 2 times daily  Dispense: 30 g; Refill: 1  History of colonic polyps - Ambulatory referral to Gastroenterology  Family history of colon cancer - Ambulatory referral to Gastroenterology   DDD (degenerative disc disease), lumbosacral 10/27/2017:  See HPI. Managed by Dr. Nelva Bush  Osteopenia She had bone density test 05/09/2013.  Lumbar T score -1.0.  Left femur T score -1.5.  Consistent with low bone mass.  She started calcium and vitamin D at that time is continued taking this.  Repeat bone density test 2 years.  DEXA --- 11/12/2015 T-Score - 1.5 Increased bone density lumbar spine compared to prior study.  No change in bone density at hip compared to prior study  10/27/2017: Above DEXA scan information reviewed. She is continuing calcium and vitamin D and weightbearing exercise.   THE FOLLOWING IS COPIED FROM HER CPE : PREVENTIVE CARE: A. screening labs.  - CBC with Differential --------------------------------------------------Normal - COMPLETE METABOLIC PANEL WITH GFR -----------------Normal - Lipid panel ----------------------------------------------------------------Normal.   - TSH ------------------------------------------------------------------------Normal - Vit D 25 hydroxy (rtn osteoporosis monitoring) -----------------Normal  B. At [place] 03/2013 she reported that she had not had Pap smear in over 20 years.   - PAP, ThinPrep, Imaging, Medicare ---Done 03/2013---Cytology Alone --Negative (No HPV Cotesting) No further Pap smear needed given age 58  C. screening colonoscopy:  At [place] 03/2013 she said that she had one colonoscopy approximately 20 years ago. She was due for a  colonoscopy regardless. However she also does have positive family history she definitely needs to proceed with colonoscopy. Her mom was diagnosed with colon cancer at age 41. She was agreeable to follow up with this.  I ordered Referral to GI at [place] 03/2013. She did have colonoscopy---07/03/2013. Per Pt--Revealed 2 polyps. Told to repeat 5 years. Repeat due 07/03/2018  D. screening mammogram:  AT [place] 03/2013 she reported she had never had one. She was agreeable   The followup office visit she reported that she had this done. Had a second mammogram/ultrasound to confirm. Was negative. Was told to followup one year.  At [place] 10/18/14--pt says she has not called and scheduled f/u--says she will go home and call to schedule.  Last mammogram 10/30/2014 negative At [place] 10/2015-- i placed order for mammogram and DEXA scan to be performed the same day. 11/12/2015-----Mammogram----Negative 04/28/2017: Discussed having her schedule follow-up mammogram.  E. screening bone density test:  At [place] 8/2014She had never had one-- she was agreeable- scheduled bone density  At Followup office visit she reported that she did have the bone density scan performed and stated that she did start calcium and vitamin D as instructed.                                                                                                Osteopenia She had bone density test 05/09/2013.  Lumbar T score -1.0.  Left femur T score -1.5.  Consistent with low bone mass.  She started calcium and vitamin D at that time is continued taking this.  Repeat bone density test 2 years. AT [facility] 10/2015-- I placed order for DEXA and Mammogram to be performed at same time   DEXA --- 11/12/2015 T-Score - 1.5 Increased bone density lumbar spine compared to prior study.  No change in bone density at hip compared to prior study         F. Vaccines:  Tetanus: Given here 10/16/2013  Pneumococcal: Pneumovax 23 --given here 04/12/2013.                                                                                          Prevnar 13---Given here 04/18/2014  Zostavax: She did check with her insurance regarding the cost of this. $95. Pt defers.   Flu Vaccine--04/28/2017: She reports that she is going to get the high dose flu vaccine at the pharmacy.    She will need to followup for routine followup visit in 6 months. Followup sooner if needed.    [address] [place], [place], [facility] 10/27/2017 10:22 AM

## 2017-11-15 ENCOUNTER — Ambulatory Visit (INDEPENDENT_AMBULATORY_CARE_PROVIDER_SITE_OTHER): Payer: PPO | Admitting: Physician Assistant

## 2017-11-15 ENCOUNTER — Encounter: Payer: Self-pay | Admitting: Physician Assistant

## 2017-11-15 VITALS — BP 140/62 | HR 88 | Ht 62.0 in | Wt 120.0 lb

## 2017-11-15 DIAGNOSIS — K6289 Other specified diseases of anus and rectum: Secondary | ICD-10-CM | POA: Diagnosis not present

## 2017-11-15 DIAGNOSIS — Z860100 Personal history of colon polyps, unspecified: Secondary | ICD-10-CM

## 2017-11-15 DIAGNOSIS — Z800 Family history of malignant neoplasm of digestive organs: Secondary | ICD-10-CM | POA: Diagnosis not present

## 2017-11-15 NOTE — Progress Notes (Addendum)
Chief Complaint: Rectal irritation  HPI:    Kristina Lucas is a 77 year old Caucasian female with a past medical history as listed below, who follows with Dr. Hilarie Fredrickson and was referred to me by Orlena Sheldon, PA-C for a complaint of rectal irritation.      07/03/13 colonoscopy with Dr. Hilarie Fredrickson for family history of colon cancer, finding of 3 sessile polyps in the descending and sigmoid colon and moderate diverticulosis.  Pathology tubular adenomas.  Repeat was recommended in 5 years.    Today, explains she has developed a rectal irritation over the past couple of months, sometimes better after using the bathroom, sometimes not.  Describes it feels like "something is always there like a piece of toilet paper".  Does describe that occasionally she will have smaller stools than others over the past 2-3 weeks, but she still feels completely empty and has no rectal bleeding or abdominal pain.  Has been using Hydrocortisone ointment per PCP as needed but this does not help.    Denies fever, chills, weight loss, anorexia, nausea, vomiting, heartburn, reflux, rectal pain or symptoms that awaken her at night.  Past Medical History:  Diagnosis Date  . COPD (chronic obstructive pulmonary disease) ([place])   . DDD (degenerative disc disease), lumbosacral 07/12/2013  . Hypertension   . Low back pain on right side with sciatica 03/27/2013    Past Surgical History:  Procedure Laterality Date  . FRACTURE SURGERY Right 01/2010   hip  . STERIOD INJECTION  05/30/13   lumbar    Current Outpatient Medications  Medication Sig Dispense Refill  . albuterol (PROVENTIL HFA;VENTOLIN HFA) 108 (90 Base) MCG/ACT inhaler Inhale 1-2 puffs into the lungs every 4 (four) hours as needed for wheezing or shortness of breath. 1 Inhaler 0  . amLODipine (NORVASC) 10 MG tablet TAKE 1 TABLET (10 MG TOTAL) BY MOUTH DAILY. 90 tablet 1  . benazepril (LOTENSIN) 20 MG tablet TAKE 1 TABLET (20 MG TOTAL) BY MOUTH DAILY. 90 tablet 1  . Calcium  Carbonate-Vitamin D (CALCIUM + D PO) Take 1 tablet by mouth 2 (two) times daily with a meal.    . hydrocortisone (ANUSOL-HC) 2.5 % rectal cream Apply rectally 2 times daily 30 g 1  . ibuprofen (ADVIL,MOTRIN) 200 MG tablet Take 200 mg by mouth every 6 (six) hours as needed for pain.     No current facility-administered medications for this visit.     Allergies as of 11/15/2017  . (No Known Allergies)    Family History  Problem Relation Age of Onset  . Cancer Mother 63       colon  . Colon cancer Mother   . Heart disease Father   . Hypertension Father   . Heart disease Brother     Social History   Socioeconomic History  . Marital status: Married    Spouse name: Not on file  . Number of children: 2  . Years of education: Not on file  . Highest education level: Not on file  Occupational History  . Occupation: retired  Scientific laboratory technician  . Financial resource strain: Not on file  . Food insecurity:    Worry: Not on file    Inability: Not on file  . Transportation needs:    Medical: Not on file    Non-medical: Not on file  Tobacco Use  . Smoking status: Former Smoker    Packs/day: 1.50    Last attempt to quit: 04/17/2009    Years since quitting: 8.5  .  Smokeless tobacco: Never Used  Substance and Sexual Activity  . Alcohol use: No  . Drug use: No  . Sexual activity: Not Currently  Lifestyle  . Physical activity:    Days per week: Not on file    Minutes per session: Not on file  . Stress: Not on file  Relationships  . Social connections:    Talks on phone: Not on file    Gets together: Not on file    Attends religious service: Not on file    Active member of club or organization: Not on file    Attends meetings of clubs or organizations: Not on file    Relationship status: Not on file  . Intimate partner violence:    Fear of current or ex partner: Not on file    Emotionally abused: Not on file    Physically abused: Not on file    Forced sexual activity: Not on file    Other Topics Concern  . Not on file  Social History Narrative   Married.    2 Children. Both Boys.   First Baby Died at 33 months old of an infection.   After had Third Baby stayed home for 15 years until her boys were grown..     Review of Systems:    Constitutional: No weight loss, fever or chills Skin: No rash Cardiovascular: No chest pain Respiratory: No SOB  Gastrointestinal: See HPI and otherwise negative Genitourinary: No dysuria  Neurological: No headache Musculoskeletal: No new muscle or joint pain Hematologic: No bleeding  Psychiatric: No history of depression or anxiety   Physical Exam:  Vital signs: BP 140/62   Pulse 88   Ht 5\' 2"  (1.575 m)   Wt 120 lb (54.4 kg)   BMI 21.95 kg/m   Constitutional:   Pleasant Caucasian female appears to be in NAD, Well developed, Well nourished, alert and cooperative Respiratory: Respirations even and unlabored. Lungs clear to auscultation bilaterally.   No wheezes, crackles, or rhonchi.  Cardiovascular: Normal S1, S2. No MRG. Regular rate and rhythm. No peripheral edema, cyanosis or pallor.  Gastrointestinal:  Soft, nondistended, nontender. No rebound or guarding. Normal bowel sounds. No appreciable masses or hepatomegaly. Rectal:  Not performed.  Skin:  + Subcutaneous 1 cm cyst, firm to palpation, on left side of buttock about 4-5 cm from rectal opening, patient points to this area as area of irritation Psychiatric: Demonstrates good judgement and reason without abnormal affect or behaviors.  RELEVANT LABS AND IMAGING: CBC    Component Value Date/Time   WBC 5.2 04/12/2013 0947   RBC 4.86 04/12/2013 0947   HGB 14.1 04/12/2013 0947   HCT 40.2 04/12/2013 0947   PLT 345 04/12/2013 0947   MCV 82.7 04/12/2013 0947   MCH 29.0 04/12/2013 0947   MCHC 35.1 04/12/2013 0947   RDW 14.0 04/12/2013 0947   LYMPHSABS 1.4 04/12/2013 0947   MONOABS 0.4 04/12/2013 0947   EOSABS 0.1 04/12/2013 0947   BASOSABS 0.0 04/12/2013 0947     CMP     Component Value Date/Time   NA 140 10/27/2017 1041   K 4.7 10/27/2017 1041   CL 102 10/27/2017 1041   CO2 27 10/27/2017 1041   GLUCOSE 93 10/27/2017 1041   BUN 16 10/27/2017 1041   CREATININE 1.14 (H) 10/27/2017 1041   CALCIUM 10.4 10/27/2017 1041   PROT 7.5 04/12/2013 0947   ALBUMIN 5.0 04/12/2013 0947   AST 16 04/12/2013 0947   ALT 12 04/12/2013 0947  ALKPHOS 54 04/12/2013 0947   BILITOT 0.5 04/12/2013 0947   GFRNONAA 47 (L) 10/27/2017 1041   GFRAA 55 (L) 10/27/2017 1041    Assessment: 1.  Rectal irritation: At area of cyst below, over the past couple of months 2.  Subcutaneous cyst on buttock: Seen at time of exam today, appears purulent filled, may benefit from I&D/removal by dermatology 3.  Family history of colon cancer: In her mother, unsure of age at diagnosis but did pass away at the age of 66 4.  Personal history of tubular adenomas: Last colonoscopy 2014, repeat recommended in 5 years, due in November of this year  Plan: 1.  Referred patient to a dermatologist for possible I&D of a very small cyst which appears to be area of irritation for patient.  Likely this is a ingrown hair or other. 2.  Discussed with patient that if her irritation is not gone or she has further concerns after her appointment with dermatologist then  we would be happy to go ahead and schedule her surveillance colonoscopy. 3.  Ensured patient was in recall for colonoscopy in November with Dr. Hilarie Fredrickson. 4.  Patient to call our clinic if she has further concerns.  She will follow with [place] as needed until time of colonoscopy.  Ellouise Newer, PA-C [facility] 11/15/2017, 9:05 AM  Cc: Orlena Sheldon, PA-C   Addendum: Reviewed and agree with initial management. Pyrtle, Lajuan Lines, MD

## 2017-11-15 NOTE — Patient Instructions (Addendum)
If you are age 75 or older, your body mass index should be between 23-30. Your Body mass index is 21.95 kg/m. If this is out of the aforementioned range listed, please consider follow up with your Primary Care Provider.  If you are age 74 or younger, your body mass index should be between 19-25. Your Body mass index is 21.95 kg/m. If this is out of the aformentioned range listed, please consider follow up with your Primary Care Provider.   You have been scheduled for an appointment with Dr. Percell Miller at [facility]. Your appointment is on 12/23/17 at 11:30 am. Please arrive at 11:20 am for registration. Make certain to bring a list of current medications, including any over the counter medications or vitamins. Also bring your co-pay if you have one as well as your insurance cards. [facility] is located at [address], [place], [place] [address]. Should you need to reschedule your appointment, please contact them at [phone].

## 2017-12-08 ENCOUNTER — Other Ambulatory Visit: Payer: Self-pay | Admitting: Physician Assistant

## 2017-12-23 DIAGNOSIS — L723 Sebaceous cyst: Secondary | ICD-10-CM | POA: Diagnosis not present

## 2018-02-22 DIAGNOSIS — L723 Sebaceous cyst: Secondary | ICD-10-CM | POA: Diagnosis not present

## 2018-02-22 DIAGNOSIS — L720 Epidermal cyst: Secondary | ICD-10-CM | POA: Diagnosis not present

## 2018-04-28 ENCOUNTER — Encounter: Payer: Self-pay | Admitting: Physician Assistant

## 2018-04-28 ENCOUNTER — Ambulatory Visit: Payer: PPO | Admitting: Physician Assistant

## 2018-04-28 ENCOUNTER — Other Ambulatory Visit: Payer: Self-pay | Admitting: Physician Assistant

## 2018-04-28 ENCOUNTER — Ambulatory Visit (INDEPENDENT_AMBULATORY_CARE_PROVIDER_SITE_OTHER): Payer: PPO | Admitting: Physician Assistant

## 2018-04-28 VITALS — BP 118/60 | HR 76 | Temp 98.2°F | Resp 14 | Ht 61.25 in | Wt 117.2 lb

## 2018-04-28 DIAGNOSIS — Z1211 Encounter for screening for malignant neoplasm of colon: Secondary | ICD-10-CM | POA: Diagnosis not present

## 2018-04-28 DIAGNOSIS — I10 Essential (primary) hypertension: Secondary | ICD-10-CM

## 2018-04-28 DIAGNOSIS — Z1231 Encounter for screening mammogram for malignant neoplasm of breast: Secondary | ICD-10-CM

## 2018-04-28 DIAGNOSIS — Z0000 Encounter for general adult medical examination without abnormal findings: Secondary | ICD-10-CM

## 2018-04-28 DIAGNOSIS — Z86010 Personal history of colonic polyps: Secondary | ICD-10-CM

## 2018-04-28 NOTE — Progress Notes (Signed)
Subjective:   Kristina Lucas is a 77 y.o. female who presents for Medicare Annual (Subsequent) preventive examination.  Cardiac Risk Factors include:  Age Past Smoker Hypertension     Objective:     Vitals: BP 118/60   Pulse 76   Temp 98.2 F (36.8 C) (Oral)   Resp 14   Ht 5' 1.25" (1.556 m)   Wt 53.2 kg   SpO2 96%   BMI 21.97 kg/m   Body mass index is 21.97 kg/m.  Advanced Directives 04/28/2018  Does Patient Have a Medical Advance Directive?   Would patient like information on creating a medical advance directive?    She does not these yet.  She was agreeable to take him some information to read.  Today I gave her handout of information that includes the following topics: Advanced Directives Living will Healthcare power of attorney  Tobacco Social History   Tobacco Use  Smoking Status Former Smoker  . Packs/day: 1.50  . Last attempt to quit: 04/17/2009  . Years since quitting: 9.0  Smokeless Tobacco Never Used            Past Medical History:  Diagnosis Date  . COPD (chronic obstructive pulmonary disease) ([place])   . DDD (degenerative disc disease), lumbosacral 07/12/2013  . Hypertension   . Low back pain on right side with sciatica 03/27/2013   Past Surgical History:  Procedure Laterality Date  . FRACTURE SURGERY Right 01/2010   hip  . STERIOD INJECTION  05/30/13   lumbar   Family History  Problem Relation Age of Onset  . Cancer Mother 56       colon  . Colon cancer Mother   . Heart disease Father   . Hypertension Father   . Heart disease Brother    Social History   Socioeconomic History  . Marital status: Married    Spouse name: Not on file  . Number of children: 2  . Years of education: Not on file  . Highest education level: Not on file  Occupational History  . Occupation: retired  Scientific laboratory technician  . Financial resource strain: Not on file  . Food insecurity:    Worry: Not on file    Inability: Not on file  . Transportation needs:   Medical: Not on file    Non-medical: Not on file  Tobacco Use  . Smoking status: Former Smoker    Packs/day: 1.50    Last attempt to quit: 04/17/2009    Years since quitting: 9.0  . Smokeless tobacco: Never Used  Substance and Sexual Activity  . Alcohol use: No  . Drug use: No  . Sexual activity: Not Currently  Lifestyle  . Physical activity:    Days per week: Not on file    Minutes per session: Not on file  . Stress: Not on file  Relationships  . Social connections:    Talks on phone: Not on file    Gets together: Not on file    Attends religious service: Not on file    Active member of club or organization: Not on file    Attends meetings of clubs or organizations: Not on file    Relationship status: Not on file  Other Topics Concern  . Not on file  Social History Narrative   Married.    2 Children. Both Boys.   First Baby Died at 8 months old of an infection.   After had Third Baby stayed home for 15 years until  her boys were grown..     Outpatient Encounter Medications as of 04/28/2018  Medication Sig  . albuterol (PROVENTIL HFA;VENTOLIN HFA) 108 (90 Base) MCG/ACT inhaler Inhale 1-2 puffs into the lungs every 4 (four) hours as needed for wheezing or shortness of breath.  Marland Kitchen amLODipine (NORVASC) 10 MG tablet TAKE 1 TABLET (10 MG TOTAL) BY MOUTH DAILY.  . benazepril (LOTENSIN) 20 MG tablet TAKE 1 TABLET (20 MG TOTAL) BY MOUTH DAILY.  . Calcium Carbonate-Vitamin D (CALCIUM + D PO) Take 1 tablet by mouth 2 (two) times daily with a meal.  . ibuprofen (ADVIL,MOTRIN) 200 MG tablet Take 200 mg by mouth every 6 (six) hours as needed for pain.  . hydrocortisone (ANUSOL-HC) 2.5 % rectal cream Apply rectally 2 times daily (Patient not taking: Reported on 04/28/2018)   No facility-administered encounter medications on file as of 04/28/2018.     Activities of Daily Living In your present state of health, do you have any difficulty performing the following activities: 04/28/2018    Hearing? N  Vision? N  Difficulty concentrating or making decisions? Y  Walking or climbing stairs? N  Dressing or bathing? N  Doing errands, shopping? N  Preparing Food and eating ? N  Using the Toilet? N  In the past six months, have you accidently leaked urine? N  Do you have problems with loss of bowel control? N  Managing your Medications? N  Managing your Finances? N  Housekeeping or managing your Housekeeping? N  Some recent data might be hidden    Patient Care Team: Rennis Golden as PCP - General (Physician Assistant)    Assessment:   This is a routine wellness examination for [place].  Exercise Activities and Dietary recommendations Current Exercise Habits: Home exercise routine, Type of exercise: treadmill, Time (Minutes): 30, Frequency (Times/Week): 3, Weekly Exercise (Minutes/Week): 90, Intensity: Mild    Fall Risk Fall Risk  04/28/2018 10/27/2017 04/28/2017 11/05/2016 10/26/2016  Falls in the past year? No No No No No  Risk for fall due to : - - History of fall(s) - -   Is the patient's home free of loose throw rugs in walkways, pet beds, electrical cords, etc?   yes      Grab bars in the bathroom? no      Handrails on the stairs?   yes      Adequate lighting?   yes    Depression Screen PHQ 2/9 Scores 04/28/2018 10/27/2017 04/28/2017 11/05/2016  PHQ - 2 Score 0 0 0 0  PHQ- 9 Score - - - 0     Cognitive Function     6CIT Screen 04/28/2018  What Year? 0 points  What month? 0 points  What time? 0 points  Count back from 20 0 points  Months in reverse 0 points  Repeat phrase 2 points  Total Score 2    Immunization History  Administered Date(s) Administered  . Influenza Whole 05/24/2013  . Influenza, High Dose Seasonal PF 05/10/2014  . Influenza-Unspecified 05/18/2015, 04/28/2017  . Pneumococcal Conjugate-13 04/18/2014  . Pneumococcal Polysaccharide-23 04/12/2013  . Tdap 10/16/2013     Screening Tests Health Maintenance  Topic Date Due  .  MAMMOGRAM  11/10/2017  . INFLUENZA VACCINE  03/17/2018  . COLONOSCOPY  07/03/2018  . TETANUS/TDAP  10/17/2023  . DEXA SCAN  Completed  . PNA vac Low Risk Adult  Completed    I have reviewed her immunizations today.   Tetanus is up-to-date.  Received that 10/16/2013.  She has received both Pneumovax 23 and Prevnar 13. In the past we had discussed Zostavax and she checked with her insurance and the cost was going to be $95 so she deferred. Today I have told her about Shingrix.  Discussed for her to check to find out cost with her insurance plan.  She is agreeable to follow-up with this. Discussed flu vaccine today.  She states that every year she and her husband go together to get the flu vaccine and they will be going to get that in the next couple of weeks.  However will does not want today.  Wants to wait and get this with her husband.   Regarding colonoscopy reviewed that her last colonoscopy was 07/03/2013.  Revealed 2 polyps.  Was to repeat 5 years.  This will be due 07/03/2018.  She sees Dr. Hilarie Fredrickson.  Wants me to go ahead and place the order for referral to see him so I have ordered that today.  She is overdue for her mammogram.  Again she states that she will be more compliant with going if I go ahead and schedule it versus her calling about this.  Therefore have order placed order for this today as well.  Would not repeat DEXA scan yet as her last one showed T score -1.5 which is good.  Can wait a little more than 2 years to repeat.  Plan:      I have personally reviewed and noted the following in the patient's chart:   . Medical and social history . Use of alcohol, tobacco or illicit drugs  . Current medications and supplements . Functional ability and status . Nutritional status . Physical activity . Advanced directives . List of other physicians . Hospitalizations, surgeries, and ER visits in previous 12 months . Vitals . Screenings to include cognitive, depression, and  falls . Referrals and appointments  In addition, I have reviewed and discussed with patient certain preventive protocols, quality metrics, and best practice recommendations. A written personalized care plan for preventive services as well as general preventive health recommendations were provided to patient.   1. Medicare annual wellness visit, subsequent  2. Essential hypertension Blood Pressure is controlled.  Continue current medication.  Check lab to monitor. - BASIC METABOLIC PANEL WITH GFR   [facility], [place]  04/28/2018

## 2018-04-29 ENCOUNTER — Encounter: Payer: PPO | Admitting: Physician Assistant

## 2018-04-29 ENCOUNTER — Other Ambulatory Visit: Payer: Self-pay

## 2018-04-29 LAB — BASIC METABOLIC PANEL WITH GFR
BUN/Creatinine Ratio: 15 (calc) (ref 6–22)
BUN: 17 mg/dL (ref 7–25)
CO2: 27 mmol/L (ref 20–32)
Calcium: 9.7 mg/dL (ref 8.6–10.4)
Chloride: 103 mmol/L (ref 98–110)
Creat: 1.17 mg/dL — ABNORMAL HIGH (ref 0.60–0.93)
GFR, EST AFRICAN AMERICAN: 53 mL/min/{1.73_m2} — AB (ref >=60)
GFR, Est Non African American: 46 mL/min/{1.73_m2} — ABNORMAL LOW (ref >=60)
Glucose, Bld: 89 mg/dL (ref 65–99)
POTASSIUM: 4.5 mmol/L (ref 3.5–5.3)
SODIUM: 140 mmol/L (ref 135–146)

## 2018-05-27 ENCOUNTER — Ambulatory Visit
Admission: RE | Admit: 2018-05-27 | Discharge: 2018-05-27 | Disposition: A | Discharge status: Home / Self Care | Payer: PPO | Source: Ambulatory Visit | Attending: Physician Assistant | Admitting: Physician Assistant

## 2018-05-27 DIAGNOSIS — Z1231 Encounter for screening mammogram for malignant neoplasm of breast: Secondary | ICD-10-CM

## 2018-05-27 LAB — HM MAMMOGRAPHY

## 2018-05-30 ENCOUNTER — Telehealth: Payer: Self-pay

## 2018-05-30 NOTE — Telephone Encounter (Signed)
Call placed to patient Spoke with Kathalene Frames and informed him that the disability form was available for the patient to pick up at the front desk. Copy has been sent to scan

## 2018-06-09 ENCOUNTER — Other Ambulatory Visit: Payer: Self-pay

## 2018-06-22 ENCOUNTER — Ambulatory Visit (AMBULATORY_SURGERY_CENTER): Payer: Self-pay

## 2018-06-22 VITALS — Ht 62.0 in | Wt 120.4 lb

## 2018-06-22 DIAGNOSIS — Z800 Family history of malignant neoplasm of digestive organs: Secondary | ICD-10-CM

## 2018-06-22 DIAGNOSIS — Z86010 Personal history of colonic polyps: Secondary | ICD-10-CM

## 2018-06-22 MED ORDER — PEG-KCL-NACL-NASULF-NA ASC-C 140 G PO SOLR: 1.0000 | Freq: Once | ORAL | Status: AC

## 2018-06-22 NOTE — Progress Notes (Signed)
Per pt, no allergies to soy or egg products.Pt not taking any weight loss meds or using  O2 at home.  Pt refused emmi video. 

## 2018-06-23 ENCOUNTER — Encounter: Payer: Self-pay | Admitting: Internal Medicine

## 2018-07-06 ENCOUNTER — Encounter: Payer: Self-pay | Admitting: Internal Medicine

## 2018-07-06 ENCOUNTER — Ambulatory Visit (AMBULATORY_SURGERY_CENTER): Payer: PPO | Admitting: Internal Medicine

## 2018-07-06 VITALS — BP 116/68 | HR 110 | Temp 98.7°F | Resp 20 | Ht 62.0 in | Wt 120.0 lb

## 2018-07-06 DIAGNOSIS — D124 Benign neoplasm of descending colon: Secondary | ICD-10-CM | POA: Diagnosis not present

## 2018-07-06 DIAGNOSIS — Z860101 Personal history of adenomatous and serrated colon polyps: Secondary | ICD-10-CM

## 2018-07-06 DIAGNOSIS — Z800 Family history of malignant neoplasm of digestive organs: Secondary | ICD-10-CM

## 2018-07-06 DIAGNOSIS — J449 Chronic obstructive pulmonary disease, unspecified: Secondary | ICD-10-CM | POA: Diagnosis not present

## 2018-07-06 DIAGNOSIS — D123 Benign neoplasm of transverse colon: Secondary | ICD-10-CM | POA: Diagnosis not present

## 2018-07-06 DIAGNOSIS — D128 Benign neoplasm of rectum: Secondary | ICD-10-CM

## 2018-07-06 DIAGNOSIS — D125 Benign neoplasm of sigmoid colon: Secondary | ICD-10-CM

## 2018-07-06 DIAGNOSIS — Z86010 Personal history of colonic polyps: Secondary | ICD-10-CM | POA: Diagnosis not present

## 2018-07-06 DIAGNOSIS — I10 Essential (primary) hypertension: Secondary | ICD-10-CM | POA: Diagnosis not present

## 2018-07-06 DIAGNOSIS — D127 Benign neoplasm of rectosigmoid junction: Secondary | ICD-10-CM | POA: Diagnosis not present

## 2018-07-06 MED ORDER — SODIUM CHLORIDE 0.9 % IV SOLN: 500.0000 mL | Freq: Once | INTRAVENOUS | Status: DC

## 2018-07-06 NOTE — Progress Notes (Signed)
Report given to PACU, vss 

## 2018-07-06 NOTE — Progress Notes (Signed)
Called to room to assist during endoscopic procedure.  Patient ID and intended procedure confirmed with present staff. Received instructions for my participation in the procedure from the performing physician.  

## 2018-07-06 NOTE — Patient Instructions (Signed)
YOU HAD AN ENDOSCOPIC PROCEDURE TODAY AT [facility]:   Refer to the procedure report that was given to you for any specific questions about what was found during the examination.  If the procedure report does not answer your questions, please call your gastroenterologist to clarify.  If you requested that your care partner not be given the details of your procedure findings, then the procedure report has been included in a sealed envelope for you to review at your convenience later.  YOU SHOULD EXPECT: Some feelings of bloating in the abdomen. Passage of more gas than usual.  Walking can help get rid of the air that was put into your GI tract during the procedure and reduce the bloating. If you had a lower endoscopy (such as a colonoscopy or flexible sigmoidoscopy) you may notice spotting of blood in your stool or on the toilet paper. If you underwent a bowel prep for your procedure, you may not have a normal bowel movement for a few days.  Please Note:  You might notice some irritation and congestion in your nose or some drainage.  This is from the oxygen used during your procedure.  There is no need for concern and it should clear up in a day or so.  SYMPTOMS TO REPORT IMMEDIATELY:   Following lower endoscopy (colonoscopy or flexible sigmoidoscopy):  Excessive amounts of blood in the stool  Significant tenderness or worsening of abdominal pains  Swelling of the abdomen that is new, acute  Fever of 100F or higher   For urgent or emergent issues, a gastroenterologist can be reached at any hour by calling [phone].   DIET:  We do recommend a small meal at first, but then you may proceed to your regular diet.  Drink plenty of fluids but you should avoid alcoholic beverages for 24 hours.  ACTIVITY:  You should plan to take it easy for the rest of today and you should NOT DRIVE or use heavy machinery until tomorrow (because of the sedation medicines used during the test).     FOLLOW UP: Our staff will call the number listed on your records the next business day following your procedure to check on you and address any questions or concerns that you may have regarding the information given to you following your procedure. If we do not reach you, we will leave a message.  However, if you are feeling well and you are not experiencing any problems, there is no need to return our call.  We will assume that you have returned to your regular daily activities without incident.  If any biopsies were taken you will be contacted by phone or by letter within the next 1-3 weeks.  Please call us at [phone] if you have not heard about the biopsies in 3 weeks.    SIGNATURES/CONFIDENTIALITY: You and/or your care partner have signed paperwork which will be entered into your electronic medical record.  These signatures attest to the fact that that the information above on your After Visit Summary has been reviewed and is understood.  Full responsibility of the confidentiality of this discharge information lies with you and/or your care-partner.  Polyp, diverticulosis, hemorrhoid information given.

## 2018-07-06 NOTE — Progress Notes (Signed)
Pt's states no medical or surgical changes since previsit or office visit. 

## 2018-07-06 NOTE — Op Note (Addendum)
[facility] Patient Name: Kristina Lucas Procedure Date: 07/06/2018 9:02 AM MRN: [id number] Endoscopist: Jerene Bears , MD Age: 77 Referring MD:  Date of Birth: 04-03-1941 Gender: Female Account #: [id number] Procedure:                Colonoscopy Indications:              Surveillance: Personal history of adenomatous                            polyps on last colonoscopy 5 years ago, Family                            history of colon cancer in a first-degree relative Medicines:                Monitored Anesthesia Care Procedure:                Pre-Anesthesia Assessment:                           - Prior to the procedure, a History and Physical                            was performed, and patient medications and                            allergies were reviewed. The patient's tolerance of                            previous anesthesia was also reviewed. The risks                            and benefits of the procedure and the sedation                            options and risks were discussed with the patient.                            All questions were answered, and informed consent                            was obtained. Prior Anticoagulants: The patient has                            taken no previous anticoagulant or antiplatelet                            agents. ASA Grade Assessment: III - A patient with                            severe systemic disease. After reviewing the risks                            and benefits, the patient was deemed in  satisfactory condition to undergo the procedure.                           After obtaining informed consent, the colonoscope                            was passed under direct vision. Throughout the                            procedure, the patient's blood pressure, pulse, and                            oxygen saturations were monitored continuously. The                            Colonoscope was  introduced through the anus and                            advanced to the cecum, identified by appendiceal                            orifice and ileocecal valve. The colonoscopy was                            performed without difficulty. The patient tolerated                            the procedure well. The quality of the bowel                            preparation was good. The ileocecal valve,                            appendiceal orifice, and rectum were photographed. Scope In: 9:21:15 AM Scope Out: 9:38:12 AM Scope Withdrawal Time: 0 hours 12 minutes 55 seconds  Total Procedure Duration: 0 hours 16 minutes 57 seconds  Findings:                 The digital rectal exam was normal.                           Five sessile polyps were found in the rectum (2),                            sigmoid colon (1), descending colon (1) and                            transverse colon (1). The polyps were 3 to 5 mm in                            size. These polyps were removed with a cold snare.                            Resection and retrieval  were complete.                           Multiple small and large-mouthed diverticula were                            found in the sigmoid colon. Diverticulosis is                            moderate.                           Internal hemorrhoids were found during                            retroflexion. The hemorrhoids were small. Complications:            No immediate complications. Estimated Blood Loss:     Estimated blood loss was minimal. Impression:               - Five 3 to 5 mm polyps in the rectum (2), in the                            sigmoid colon (1), in the descending colon (1) and                            in the transverse colon (1), removed with a cold                            snare. Resected and retrieved.                           - Diverticulosis in the sigmoid colon.                           - Small internal  hemorrhoids. Recommendation:           - Patient has a contact number available for                            emergencies. The signs and symptoms of potential                            delayed complications were discussed with the                            patient. Return to normal activities tomorrow.                            Written discharge instructions were provided to the                            patient.                           - Resume previous diet.                           -  Continue present medications.                           - Await pathology results.                           - Repeat colonoscopy is recommended. The                            colonoscopy date will be determined after pathology                            results from today's exam become available for                            review. Jerene Bears, MD 07/06/2018 9:42:24 AM This report has been signed electronically.

## 2018-07-07 ENCOUNTER — Telehealth: Payer: Self-pay

## 2018-07-07 NOTE — Telephone Encounter (Signed)
  Follow up Call-  Call back number 07/06/2018  Post procedure Call Back phone  # [phone]  Permission to leave phone message Yes  Some recent data might be hidden     Patient questions:  Do you have a fever, pain , or abdominal swelling? No. Pain Score  0 *  Have you tolerated food without any problems? Yes.    Have you been able to return to your normal activities? Yes.    Do you have any questions about your discharge instructions: Diet   No. Medications  No. Follow up visit  No.  Do you have questions or concerns about your Care? No.  Actions: * If pain score is 4 or above: No action needed, pain <4.

## 2018-07-12 ENCOUNTER — Encounter: Payer: Self-pay | Admitting: Internal Medicine

## 2018-10-27 ENCOUNTER — Ambulatory Visit: Payer: PPO | Admitting: Physician Assistant

## 2018-10-28 ENCOUNTER — Ambulatory Visit: Payer: PPO | Admitting: Family Medicine

## 2018-10-31 ENCOUNTER — Other Ambulatory Visit: Payer: Self-pay

## 2018-10-31 ENCOUNTER — Encounter: Payer: Self-pay | Admitting: Family Medicine

## 2018-10-31 ENCOUNTER — Ambulatory Visit (INDEPENDENT_AMBULATORY_CARE_PROVIDER_SITE_OTHER): Payer: PPO | Admitting: Family Medicine

## 2018-10-31 VITALS — BP 132/68 | HR 90 | Temp 98.3°F | Resp 16 | Ht 62.0 in | Wt 117.0 lb

## 2018-10-31 DIAGNOSIS — M8580 Other specified disorders of bone density and structure, unspecified site: Secondary | ICD-10-CM | POA: Diagnosis not present

## 2018-10-31 DIAGNOSIS — J439 Emphysema, unspecified: Secondary | ICD-10-CM

## 2018-10-31 DIAGNOSIS — M5137 Other intervertebral disc degeneration, lumbosacral region: Secondary | ICD-10-CM

## 2018-10-31 DIAGNOSIS — I10 Essential (primary) hypertension: Secondary | ICD-10-CM | POA: Diagnosis not present

## 2018-10-31 MED ORDER — BENAZEPRIL HCL 20 MG PO TABS: ORAL_TABLET | ORAL | 2 refills | Status: DC

## 2018-10-31 MED ORDER — AMLODIPINE BESYLATE 10 MG PO TABS: ORAL_TABLET | ORAL | 2 refills | Status: DC

## 2018-10-31 NOTE — Patient Instructions (Signed)
F/U 6 months for wellness visit

## 2018-10-31 NOTE — Progress Notes (Signed)
   Subjective:    Patient ID: Kristina Lucas, female    DOB: [birth year], 78 y.o.   MRN: [id number]  Patient presents for Follow-up (is fasting)  Pt here to f/u chronic medical problems , previous pt of our PA who has left the practice, this is my first visit with her meds and history reveied   Has has some cough and congestion for past few weeks, taking mucinex, only used albuterol twice. She had fever initially. Visited her Cousin a month ago and she had pneumonia.   Cough has improved, no difficulty breathing  Has underlying COPD but has never been on maintanance inhalers, found after difficulty with anesthesia after a colonoscopy   Quit tobacco 13 years ago    HTN- taking norvasc and benzapril without diffuculty    Osteopenia- last Bone Density in 2017, Has not been on her calcium and vitamin D    Has had flu shot and PNA vaccines   Colonoscopy UTD/ Mammogram UTD      Review Of Systems:  GEN- denies fatigue, fever, weight loss,weakness, recent illness HEENT- denies eye drainage, change in vision, nasal discharge, CVS- denies chest pain, palpitations RESP- denies SOB, +cough, wheeze ABD- denies N/V, change in stools, abd pain GU- denies dysuria, hematuria, dribbling, incontinence MSK- denies joint pain, muscle aches, injury Neuro- denies headache, dizziness, syncope, seizure activity       Objective:    BP 132/68   Pulse 90   Temp 98.3 F (36.8 C) (Oral)   Resp 16   Ht 5\' 2"  (1.575 m)   Wt 117 lb (53.1 kg)   SpO2 98%   BMI 21.40 kg/m  GEN- NAD, alert and oriented x3 HEENT- PERRL, EOMI, non injected sclera, pink conjunctiva, MMM, oropharynx clear Neck- Supple, no thyromegaly CVS- RRR, no murmur RESP-CTAB ABD-NABS,soft,NT,ND EXT- No edema Pulses- Radial, DP- 2+        Assessment & Plan:      Problem List Items Addressed This Visit      Unprioritized   COPD (chronic obstructive pulmonary disease) (HCC)    Very mild symptoms, no current tobacco  use Prn albuterol Recent URI resolved       DDD (degenerative disc disease), lumbosacral   Hypertension - Primary    Well controlled no changes, fasting labs today        Relevant Medications   amLODipine (NORVASC) 10 MG tablet   benazepril (LOTENSIN) 20 MG tablet   Other Relevant Orders   CBC with Differential/Platelet   Comprehensive metabolic panel   Lipid panel   Osteopenia    Restart calcium and vitamin D, declines fuirther bone density          Note: This dictation was prepared with Dragon dictation along with smaller phrase technology. Any transcriptional errors that result from this process are unintentional.

## 2018-10-31 NOTE — Assessment & Plan Note (Signed)
Very mild symptoms, no current tobacco use Prn albuterol Recent URI resolved

## 2018-10-31 NOTE — Assessment & Plan Note (Signed)
Restart calcium and vitamin D, declines fuirther bone density

## 2018-10-31 NOTE — Assessment & Plan Note (Signed)
Well controlled no changes, fasting labs today

## 2018-11-01 LAB — COMPREHENSIVE METABOLIC PANEL
AG RATIO: 1.3 (calc) (ref 1.0–2.5)
ALT: 7 U/L (ref 6–29)
AST: 12 U/L (ref 10–35)
Albumin: 4.3 g/dL (ref 3.6–5.1)
Alkaline phosphatase (APISO): 61 U/L (ref 37–153)
BILIRUBIN TOTAL: 0.4 mg/dL (ref 0.2–1.2)
BUN/Creatinine Ratio: 14 (calc) (ref 6–22)
BUN: 15 mg/dL (ref 7–25)
CALCIUM: 10.3 mg/dL (ref 8.6–10.4)
CO2: 26 mmol/L (ref 20–32)
Chloride: 108 mmol/L (ref 98–110)
Creat: 1.11 mg/dL — ABNORMAL HIGH (ref 0.60–0.93)
Globulin: 3.3 g/dL (calc) (ref 1.9–3.7)
Glucose, Bld: 98 mg/dL (ref 65–99)
Potassium: 6 mmol/L — ABNORMAL HIGH (ref 3.5–5.3)
Sodium: 144 mmol/L (ref 135–146)
Total Protein: 7.6 g/dL (ref 6.1–8.1)

## 2018-11-01 LAB — CBC WITH DIFFERENTIAL/PLATELET
Absolute Monocytes: 599 cells/uL (ref 200–950)
Basophils Absolute: 8 cells/uL (ref 0–200)
Basophils Relative: 0.1 %
EOS ABS: 89 {cells}/uL (ref 15–500)
EOS PCT: 1.1 %
HEMATOCRIT: 37.7 % (ref 35.0–45.0)
HEMOGLOBIN: 12.7 g/dL (ref 11.7–15.5)
LYMPHS ABS: 1531 {cells}/uL (ref 850–3900)
MCH: 28.5 pg (ref 27.0–33.0)
MCHC: 33.7 g/dL (ref 32.0–36.0)
MCV: 84.5 fL (ref 80.0–100.0)
MONOS PCT: 7.4 %
MPV: 10.6 fL (ref 7.5–12.5)
NEUTROS ABS: 5873 {cells}/uL (ref 1500–7800)
Neutrophils Relative %: 72.5 %
PLATELETS: 438 10*3/uL — AB (ref 140–400)
RBC: 4.46 10*6/uL (ref 3.80–5.10)
RDW: 13.1 % (ref 11.0–15.0)
Total Lymphocyte: 18.9 %
WBC: 8.1 10*3/uL (ref 3.8–10.8)

## 2018-11-01 LAB — LIPID PANEL
Cholesterol: 186 mg/dL (ref <200)
HDL: 56 mg/dL (ref >=50)
LDL Cholesterol (Calc): 109 mg/dL (calc) — ABNORMAL HIGH
Non-HDL Cholesterol (Calc): 130 mg/dL (calc) — ABNORMAL HIGH (ref <130)
TRIGLYCERIDES: 107 mg/dL (ref <150)
Total CHOL/HDL Ratio: 3.3 (calc) (ref <5.0)

## 2018-11-03 ENCOUNTER — Other Ambulatory Visit: Payer: Self-pay | Admitting: Family Medicine

## 2018-11-03 DIAGNOSIS — E875 Hyperkalemia: Secondary | ICD-10-CM

## 2018-11-03 MED ORDER — SODIUM POLYSTYRENE SULFONATE 15 GM/60ML PO SUSP: 30.0000 g | Freq: Once | ORAL | 0 refills | Status: DC

## 2018-11-04 ENCOUNTER — Other Ambulatory Visit: Payer: Self-pay

## 2018-11-04 MED ORDER — SODIUM POLYSTYRENE SULFONATE 15 GM/60ML PO SUSP: 30.0000 g | Freq: Once | ORAL | 0 refills | Status: AC

## 2018-11-07 ENCOUNTER — Other Ambulatory Visit: Payer: Self-pay

## 2018-11-07 ENCOUNTER — Other Ambulatory Visit: Payer: PPO

## 2018-11-07 DIAGNOSIS — E875 Hyperkalemia: Secondary | ICD-10-CM

## 2018-11-08 ENCOUNTER — Other Ambulatory Visit: Payer: Self-pay | Admitting: *Deleted

## 2018-11-08 DIAGNOSIS — E875 Hyperkalemia: Secondary | ICD-10-CM

## 2018-11-08 LAB — BASIC METABOLIC PANEL
BUN/Creatinine Ratio: 12 (calc) (ref 6–22)
BUN: 13 mg/dL (ref 7–25)
CALCIUM: 10.3 mg/dL (ref 8.6–10.4)
CO2: 27 mmol/L (ref 20–32)
Chloride: 105 mmol/L (ref 98–110)
Creat: 1.12 mg/dL — ABNORMAL HIGH (ref 0.60–0.93)
GLUCOSE: 92 mg/dL (ref 65–99)
Potassium: 5.7 mmol/L — ABNORMAL HIGH (ref 3.5–5.3)
SODIUM: 142 mmol/L (ref 135–146)

## 2018-11-08 MED ORDER — HYDROCHLOROTHIAZIDE 12.5 MG PO CAPS: 12.5000 mg | ORAL_CAPSULE | Freq: Every day | ORAL | 1 refills | Status: DC

## 2018-11-17 ENCOUNTER — Other Ambulatory Visit: Payer: PPO

## 2018-11-17 ENCOUNTER — Other Ambulatory Visit: Payer: Self-pay

## 2018-11-17 DIAGNOSIS — E875 Hyperkalemia: Secondary | ICD-10-CM | POA: Diagnosis not present

## 2018-11-17 DIAGNOSIS — S71152A Open bite, left thigh, initial encounter: Secondary | ICD-10-CM | POA: Diagnosis not present

## 2018-11-18 LAB — BASIC METABOLIC PANEL
BUN/Creatinine Ratio: 16 (calc) (ref 6–22)
BUN: 18 mg/dL (ref 7–25)
CO2: 26 mmol/L (ref 20–32)
Calcium: 10.1 mg/dL (ref 8.6–10.4)
Chloride: 102 mmol/L (ref 98–110)
Creat: 1.11 mg/dL — ABNORMAL HIGH (ref 0.60–0.93)
Glucose, Bld: 93 mg/dL (ref 65–99)
Potassium: 4.7 mmol/L (ref 3.5–5.3)
Sodium: 140 mmol/L (ref 135–146)

## 2018-12-06 ENCOUNTER — Telehealth: Payer: Self-pay | Admitting: *Deleted

## 2018-12-06 NOTE — Telephone Encounter (Signed)
Received call from patient. [phone]~ telephone.   Reports that her BP readings have been ranging between 125-129/ 68-74. States that she did have one random high BP reading at 148/77.  MD to be made aware.

## 2018-12-06 NOTE — Telephone Encounter (Signed)
Call placed to patient and patient made aware.  

## 2018-12-06 NOTE — Telephone Encounter (Signed)
BP readings look good, no changes

## 2019-01-19 ENCOUNTER — Other Ambulatory Visit: Payer: Self-pay | Admitting: Family Medicine

## 2019-05-17 ENCOUNTER — Other Ambulatory Visit: Payer: Self-pay

## 2019-05-17 ENCOUNTER — Encounter: Payer: Self-pay | Admitting: Family Medicine

## 2019-05-17 ENCOUNTER — Ambulatory Visit (INDEPENDENT_AMBULATORY_CARE_PROVIDER_SITE_OTHER): Payer: PPO | Admitting: Family Medicine

## 2019-05-17 VITALS — BP 128/68 | HR 82 | Temp 98.5°F | Resp 14 | Ht 62.0 in | Wt 119.0 lb

## 2019-05-17 DIAGNOSIS — Z789 Other specified health status: Secondary | ICD-10-CM | POA: Diagnosis not present

## 2019-05-17 DIAGNOSIS — Z0001 Encounter for general adult medical examination with abnormal findings: Secondary | ICD-10-CM | POA: Diagnosis not present

## 2019-05-17 DIAGNOSIS — I10 Essential (primary) hypertension: Secondary | ICD-10-CM | POA: Diagnosis not present

## 2019-05-17 DIAGNOSIS — J439 Emphysema, unspecified: Secondary | ICD-10-CM

## 2019-05-17 DIAGNOSIS — R0789 Other chest pain: Secondary | ICD-10-CM

## 2019-05-17 DIAGNOSIS — Z0000 Encounter for general adult medical examination without abnormal findings: Secondary | ICD-10-CM

## 2019-05-17 MED ORDER — ALBUTEROL SULFATE HFA 108 (90 BASE) MCG/ACT IN AERS: 1.0000 | INHALATION_SPRAY | RESPIRATORY_TRACT | 1 refills | Status: DC | PRN

## 2019-05-17 MED ORDER — HYDROCHLOROTHIAZIDE 12.5 MG PO CAPS: 12.5000 mg | ORAL_CAPSULE | Freq: Every day | ORAL | 2 refills | Status: DC

## 2019-05-17 MED ORDER — AMLODIPINE BESYLATE 10 MG PO TABS: ORAL_TABLET | ORAL | 2 refills | Status: DC

## 2019-05-17 NOTE — Progress Notes (Signed)
Subjective:   Patient presents for Medicare Annual/Subsequent preventive examination.  Pt here for CPE, medications reviewed  htn- taking bp meds as prescribed no concerns   Copd- rare use of albuterol, no recent exacerbations  OSTEOPENIA/ DDD - no concerns takes vitamin D and Calcium    Review Past Medical/Family/Social: Per Emr   Risk Factors  Current exercise habits: tries to stay active Dietary issues discussed: discussed caffiene - drinks 8 cups of coffee a day   Cardiac risk factors: HTN Depression Screen  (Note: if answer to either of the following is "Yes", a more complete depression screening is indicated)  Over the past two weeks, have you felt down, depressed or hopeless? No Over the past two weeks, have you felt little interest or pleasure in doing things? No Have you lost interest or pleasure in daily life? No Do you often feel hopeless? No Do you cry easily over simple problems? No   Activities of Daily Living  In your present state of health, do you have any difficulty performing the following activities?:  Driving? No  Managing money? No  Feeding yourself? No  Getting from bed to chair? No  Climbing a flight of stairs? No  Preparing food and eating?: No  Bathing or showering? No  Getting dressed: No  Getting to the toilet? No  Using the toilet:No  Moving around from place to place: No  In the past year have you fallen or had a near fall?:No  Are you sexually active? No  Do you have more than one partner? No   Hearing Difficulties: No  Do you often ask people to speak up or repeat themselves? No  Do you experience ringing or noises in your ears? No Do you have difficulty understanding soft or whispered voices? No  Do you feel that you have a problem with memory? No Do you often misplace items? No  Do you feel safe at home? Yes  Cognitive Testing  Alert? Yes Normal Appearance?Yes  Oriented to person? Yes Place? Yes  Time? Yes  Recall of three  objects? Yes  Can perform simple calculations? Yes  Displays appropriate judgment?Yes  Can read the correct time from a watch face?Yes   List the Names of Other Physician/Practitioners you currently use:   Goes to eye doctor every few years, no concern with vision    Screening Tests / Date Colonoscopy      UTD               Zostavax  - DISCUSSED but pt able to afford right  Mammogram  UTD  Influenza Vaccine  UTD Tetanus/tdap UTD  ROS: GEN- denies fatigue, fever, weight loss,weakness, recent illness HEENT- denies eye drainage, change in vision, nasal discharge, CVS- denies chest pain, palpitations RESP- denies SOB, cough, wheeze ABD- denies N/V, change in stools, abd pain GU- denies dysuria, hematuria, dribbling, incontinence MSK- denies joint pain, muscle aches, injury Neuro- denies headache, dizziness, syncope, seizure activity  Physical: vitals reviewed  GEN- NAD, alert and oriented x3 HEENT- PERRL, EOMI, non injected sclera, pink conjunctiva, MMM, oropharynx clear Neck- Supple, no thryomegaly , no bruit  CVS- RRR, no murmur RESP-CTAB abd-nabs,soft,NT,ND  EXT- No edema Pulses- Radial, DP- 2+    Assessment:    Annual wellness medicare exam   Plan:    During the course of the visit the patient was educated and counseled about appropriate screening and preventive services including:     Fall/audit C/Depression- UTD  HTN- well controlled , continue       COPD- has albuterol  inhaler on hand , FLu shot UTD      Osteopenia- taking calcium Vitamin D      Fsating labs obtained     Declines further mammo/bone density       Discussed cutting back on caffiene , 1 cup at a time to avoid withdrawal effects   Discussed advanced directives pt is full code, but does not want prolonged life support   given handout     Diet review for nutrition referral? Yes ____ Not Indicated __x__  Patient Instructions (the written plan) was given to the patient.  Medicare  Attestation  I have personally reviewed:  The patient's medical and social history  Their use of alcohol, tobacco or illicit drugs  Their current medications and supplements  The patient's functional ability including ADLs,fall risks, home safety risks, cognitive, and hearing and visual impairment  Diet and physical activities  Evidence for depression or mood disorders  The patient's weight, height, BMI, and visual acuity have been recorded in the chart. I have made referrals, counseling, and provided education to the patient based on review of the above and I have provided the patient with a written personalized care plan for preventive services.

## 2019-05-17 NOTE — Patient Instructions (Signed)
F/U 6 months  We will call with lab results  

## 2019-05-18 ENCOUNTER — Other Ambulatory Visit: Payer: Self-pay | Admitting: Family Medicine

## 2019-05-18 LAB — CBC WITH DIFFERENTIAL/PLATELET
Absolute Monocytes: 415 cells/uL (ref 200–950)
Basophils Absolute: 0 cells/uL (ref 0–200)
Basophils Relative: 0 %
Eosinophils Absolute: 61 cells/uL (ref 15–500)
Eosinophils Relative: 0.9 %
HCT: 39.7 % (ref 35.0–45.0)
Hemoglobin: 13.5 g/dL (ref 11.7–15.5)
Lymphs Abs: 1224 cells/uL (ref 850–3900)
MCH: 28.8 pg (ref 27.0–33.0)
MCHC: 34 g/dL (ref 32.0–36.0)
MCV: 84.8 fL (ref 80.0–100.0)
MPV: 10.7 fL (ref 7.5–12.5)
Monocytes Relative: 6.1 %
Neutro Abs: 5100 cells/uL (ref 1500–7800)
Neutrophils Relative %: 75 %
Platelets: 354 10*3/uL (ref 140–400)
RBC: 4.68 10*6/uL (ref 3.80–5.10)
RDW: 13.1 % (ref 11.0–15.0)
Total Lymphocyte: 18 %
WBC: 6.8 10*3/uL (ref 3.8–10.8)

## 2019-05-18 LAB — COMPREHENSIVE METABOLIC PANEL
AG Ratio: 1.5 (calc) (ref 1.0–2.5)
ALT: 7 U/L (ref 6–29)
AST: 12 U/L (ref 10–35)
Albumin: 4.4 g/dL (ref 3.6–5.1)
Alkaline phosphatase (APISO): 65 U/L (ref 37–153)
BUN/Creatinine Ratio: 15 (calc) (ref 6–22)
BUN: 17 mg/dL (ref 7–25)
CO2: 28 mmol/L (ref 20–32)
Calcium: 9.8 mg/dL (ref 8.6–10.4)
Chloride: 102 mmol/L (ref 98–110)
Creat: 1.16 mg/dL — ABNORMAL HIGH (ref 0.60–0.93)
Globulin: 2.9 g/dL (calc) (ref 1.9–3.7)
Glucose, Bld: 96 mg/dL (ref 65–99)
Potassium: 4 mmol/L (ref 3.5–5.3)
Sodium: 140 mmol/L (ref 135–146)
Total Bilirubin: 0.5 mg/dL (ref 0.2–1.2)
Total Protein: 7.3 g/dL (ref 6.1–8.1)

## 2019-05-18 LAB — LIPID PANEL
Cholesterol: 201 mg/dL — ABNORMAL HIGH (ref <200)
HDL: 67 mg/dL (ref >=50)
LDL Cholesterol (Calc): 114 mg/dL (calc) — ABNORMAL HIGH
Non-HDL Cholesterol (Calc): 134 mg/dL (calc) — ABNORMAL HIGH (ref <130)
Total CHOL/HDL Ratio: 3 (calc) (ref <5.0)
Triglycerides: 97 mg/dL (ref <150)

## 2019-11-13 ENCOUNTER — Ambulatory Visit: Payer: PPO | Admitting: Family Medicine

## 2019-11-20 ENCOUNTER — Other Ambulatory Visit: Payer: Self-pay

## 2019-11-20 ENCOUNTER — Encounter: Payer: Self-pay | Admitting: Family Medicine

## 2019-11-20 ENCOUNTER — Ambulatory Visit (INDEPENDENT_AMBULATORY_CARE_PROVIDER_SITE_OTHER): Payer: PPO | Admitting: Family Medicine

## 2019-11-20 VITALS — BP 110/66 | HR 96 | Temp 98.1°F | Resp 14 | Ht 62.0 in | Wt 121.0 lb

## 2019-11-20 DIAGNOSIS — M8580 Other specified disorders of bone density and structure, unspecified site: Secondary | ICD-10-CM

## 2019-11-20 DIAGNOSIS — J439 Emphysema, unspecified: Secondary | ICD-10-CM

## 2019-11-20 DIAGNOSIS — I10 Essential (primary) hypertension: Secondary | ICD-10-CM

## 2019-11-20 MED ORDER — AMLODIPINE BESYLATE 10 MG PO TABS: ORAL_TABLET | ORAL | 2 refills | Status: DC

## 2019-11-20 MED ORDER — HYDROCHLOROTHIAZIDE 12.5 MG PO CAPS: 12.5000 mg | ORAL_CAPSULE | Freq: Every day | ORAL | 1 refills | Status: DC

## 2019-11-20 MED ORDER — ALBUTEROL SULFATE HFA 108 (90 BASE) MCG/ACT IN AERS: 1.0000 | INHALATION_SPRAY | RESPIRATORY_TRACT | 1 refills | Status: DC | PRN

## 2019-11-20 NOTE — Assessment & Plan Note (Addendum)
Blood pressure controlled no change in medication. With her age and diuretic with recheck her renal function.  Her creatinine baseline is typically 1.1

## 2019-11-20 NOTE — Assessment & Plan Note (Signed)
No recent exacerbations.  Patient has had COVID-19 vaccine

## 2019-11-20 NOTE — Assessment & Plan Note (Signed)
Restart calcium and vitamin D supplementation

## 2019-11-20 NOTE — Progress Notes (Signed)
   Subjective:    Patient ID: Kristina Lucas, female    DOB: Feb 19, 1941, 79 y.o.   MRN: [id number]  Patient presents for Follow-up (is not fasting)   Pt here to f/u chronic medical probems she has no new concerns today.  Medications reviewed.   HTN- well controlled taking amlodipine and HCTZ without any difficulty      COPD- has albuterol  inhaler on hand  Has used very rarely , FLu shot UTD / COVID-19 vaccine UTD      Osteopenia- she ran out of calcium Vitamin D a month or so ago but she plans to restart this.    She rarely uses OTC meds   I reviewed her last set of labs with her at the bedside   Review Of Systems:  GEN- denies fatigue, fever, weight loss,weakness, recent illness HEENT- denies eye drainage, change in vision, nasal discharge, CVS- denies chest pain, palpitations RESP- denies SOB, cough, wheeze ABD- denies N/V, change in stools, abd pain GU- denies dysuria, hematuria, dribbling, incontinence MSK- denies joint pain, muscle aches, injury Neuro- denies headache, dizziness, syncope, seizure activity       Objective:    BP 110/66   Pulse 96   Temp 98.1 F (36.7 C) (Temporal)   Resp 14   Ht 5\' 2"  (1.575 m)   Wt 121 lb (54.9 kg)   SpO2 100%   BMI 22.13 kg/m  GEN- NAD, alert and oriented x3 HEENT- PERRL, EOMI, non injected sclera, pink conjunctiva, MMM, oropharynx clear Neck- Supple, no thyromegaly CVS- RRR, no murmur RESP-CTAB ABD-NABS,soft,NT,ND EXT- No edema Pulses- Radial, DP- 2+        Assessment & Plan:      Problem List Items Addressed This Visit      Unprioritized   COPD (chronic obstructive pulmonary disease) (HCC)    No recent exacerbations.  Patient has had COVID-19 vaccine      Relevant Medications   albuterol (VENTOLIN HFA) 108 (90 Base) MCG/ACT inhaler   Hypertension - Primary    Blood pressure controlled no change in medication. With her age and diuretic with recheck her renal function.  Her creatinine baseline is  typically 1.1      Relevant Medications   amLODipine (NORVASC) 10 MG tablet   hydrochlorothiazide (MICROZIDE) 12.5 MG capsule   Other Relevant Orders   Basic metabolic panel   Osteopenia    Restart calcium and vitamin D supplementation         Note: This dictation was prepared with Dragon dictation along with smaller phrase technology. Any transcriptional errors that result from this process are unintentional.

## 2019-11-20 NOTE — Patient Instructions (Signed)
Continue your current medications  We will call with lab results  F/U 6 MONTHS for Physical

## 2019-11-21 LAB — BASIC METABOLIC PANEL
BUN/Creatinine Ratio: 9 (calc) (ref 6–22)
BUN: 10 mg/dL (ref 7–25)
CO2: 28 mmol/L (ref 20–32)
Calcium: 9.7 mg/dL (ref 8.6–10.4)
Chloride: 95 mmol/L — ABNORMAL LOW (ref 98–110)
Creat: 1.12 mg/dL — ABNORMAL HIGH (ref 0.60–0.93)
Glucose, Bld: 99 mg/dL (ref 65–99)
Potassium: 3.9 mmol/L (ref 3.5–5.3)
Sodium: 136 mmol/L (ref 135–146)

## 2020-03-15 DIAGNOSIS — H43813 Vitreous degeneration, bilateral: Secondary | ICD-10-CM | POA: Diagnosis not present

## 2020-04-30 DIAGNOSIS — H25043 Posterior subcapsular polar age-related cataract, bilateral: Secondary | ICD-10-CM | POA: Diagnosis not present

## 2020-04-30 DIAGNOSIS — H18413 Arcus senilis, bilateral: Secondary | ICD-10-CM | POA: Diagnosis not present

## 2020-04-30 DIAGNOSIS — H2513 Age-related nuclear cataract, bilateral: Secondary | ICD-10-CM | POA: Diagnosis not present

## 2020-04-30 DIAGNOSIS — H25013 Cortical age-related cataract, bilateral: Secondary | ICD-10-CM | POA: Diagnosis not present

## 2020-04-30 DIAGNOSIS — H2511 Age-related nuclear cataract, right eye: Secondary | ICD-10-CM | POA: Diagnosis not present

## 2020-04-30 DIAGNOSIS — H35371 Puckering of macula, right eye: Secondary | ICD-10-CM | POA: Diagnosis not present

## 2020-05-17 DIAGNOSIS — H2512 Age-related nuclear cataract, left eye: Secondary | ICD-10-CM | POA: Diagnosis not present

## 2020-05-17 DIAGNOSIS — H2511 Age-related nuclear cataract, right eye: Secondary | ICD-10-CM | POA: Diagnosis not present

## 2020-05-28 ENCOUNTER — Other Ambulatory Visit: Payer: Self-pay

## 2020-05-28 ENCOUNTER — Encounter: Payer: Self-pay | Admitting: Family Medicine

## 2020-05-28 ENCOUNTER — Ambulatory Visit (INDEPENDENT_AMBULATORY_CARE_PROVIDER_SITE_OTHER): Payer: PPO | Admitting: Family Medicine

## 2020-05-28 VITALS — BP 118/60 | HR 90 | Temp 97.8°F | Resp 16 | Ht 62.0 in | Wt 123.0 lb

## 2020-05-28 DIAGNOSIS — I10 Essential (primary) hypertension: Secondary | ICD-10-CM | POA: Diagnosis not present

## 2020-05-28 DIAGNOSIS — Z0001 Encounter for general adult medical examination with abnormal findings: Secondary | ICD-10-CM

## 2020-05-28 DIAGNOSIS — Z0000 Encounter for general adult medical examination without abnormal findings: Secondary | ICD-10-CM

## 2020-05-28 DIAGNOSIS — Z789 Other specified health status: Secondary | ICD-10-CM

## 2020-05-28 DIAGNOSIS — Z1159 Encounter for screening for other viral diseases: Secondary | ICD-10-CM | POA: Diagnosis not present

## 2020-05-28 DIAGNOSIS — J439 Emphysema, unspecified: Secondary | ICD-10-CM

## 2020-05-28 DIAGNOSIS — M8580 Other specified disorders of bone density and structure, unspecified site: Secondary | ICD-10-CM | POA: Diagnosis not present

## 2020-05-28 LAB — CBC WITH DIFFERENTIAL/PLATELET
Eosinophils Absolute: 68 cells/uL (ref 15–500)
Eosinophils Relative: 1.2 %
Hemoglobin: 13.7 g/dL (ref 11.7–15.5)
Monocytes Relative: 10.4 %
Neutrophils Relative %: 66.8 %
RBC: 4.81 10*6/uL (ref 3.80–5.10)
RDW: 13.1 % (ref 11.0–15.0)

## 2020-05-28 NOTE — Progress Notes (Signed)
Subjective:   Patient presents for Medicare Annual/Subsequent preventive examination.   Here for annual wellness visit.  Medications reviewed. Hypertension she is taking her medicines as prescribed HCTZ and amlodipine no chest pain or shortness of breath.  COPD she has albuterol on hand she has not had any exacerbations she is due for flu shot.  COVID-19 vaccine up-to-date  Osteopenia-  she is on calcium and vitamin D back to keep her self active  She is scheduled for cataract surgery    Review Past Medical/Family/Social: per EMR   Risk Factors  Current exercise habits: tries to stay active Dietary issues discussed: discussed caffiene  Cardiac risk factors: HTN Depression Screen  (Note: if answer to either of the following is "Yes", a more complete depression screening is indicated)  Over the past two weeks, have you felt down, depressed or hopeless? No Over the past two weeks, have you felt little interest or pleasure in doing things? No Have you lost interest or pleasure in daily life? No Do you often feel hopeless? No Do you cry easily over simple problems? No   Activities of Daily Living  In your present state of health, do you have any difficulty performing the following activities?:  Driving? No  Managing money? No  Feeding yourself? No  Getting from bed to chair? No  Climbing a flight of stairs? No  Preparing food and eating?: No  Bathing or showering? No  Getting dressed: No  Getting to the toilet? No  Using the toilet:No  Moving around from place to place: No   Hearing Difficulties: No  Do you often ask people to speak up or repeat themselves? No  Do you experience ringing or noises in your ears? No Do you have difficulty understanding soft or whispered voices? No  Do you feel that you have a problem with memory? No Do you often misplace items? No  Do you feel safe at home? Yes  Cognitive Testing  Alert? Yes Normal Appearance?Yes  Oriented to person? Yes  Place? Yes  Time? Yes  Recall of three objects? Yes  Can perform simple calculations? Yes  Displays appropriate judgment?Yes  Can read the correct time from a watch face?Yes   List the Names of Other Physician/Practitioners you currently use:  Ophthalmology - [facility]    Screening Tests / Date Colonoscopy      UTD               COVID-19 UTD Zostavax unable toa fford  Mammogram  Done Oct 2019   Influenza Vaccine  UTD Tetanus/tdap UTD Bone Density done  2017   ROS: GEN- denies fatigue, fever, weight loss,weakness, recent illness HEENT- denies eye drainage, change in vision, nasal discharge, CVS- denies chest pain, palpitations RESP- denies SOB, cough, wheeze ABD- denies N/V, change in stools, abd pain GU- denies dysuria, hematuria, dribbling, incontinence MSK- denies joint pain, muscle aches, injury Neuro- denies headache, dizziness, syncope, seizure activity  Physical: vitals reviewed  GEN- NAD, alert and oriented x3 HEENT- PERRL, EOMI, non injected sclera, pink conjunctiva, MMM, oropharynx clear Neck- Supple, no thryomegaly , no bruit  CVS- RRR, no murmur RESP-CTAB abd-nabs,soft,NT,ND  EXT- No edema Pulses- Radial, DP- 2+    Assessment:    Annual wellness medicare exam   Plan:    During the course of the visit the patient was educated and counseled about appropriate screening and preventive services including:     Fall/audit C/Depression- negative     Declines further mammo/bone  density     Immunizations- Flu shot will be done at pharmacy at pt request, she will have Cataract surgery on 10/29, then get COVID booster     HTN- well controlled , continue norvasc,     She has maintained weight     COPD- has albuterol  inhaler on hand , FLu shot UTD      Osteopenia- taking calcium Vitamin D       Fsating labs obtained    Due for Hep C screening    Discussed advanced directives pt is full code, but does not want prolonged life  support   given handout  Diet review for nutrition referral? Yes ____ Not Indicated __x__  Patient Instructions (the written plan) was given to the patient.  Medicare Attestation  I have personally reviewed:  The patient's medical and social history  Their use of alcohol, tobacco or illicit drugs  Their current medications and supplements  The patient's functional ability including ADLs,fall risks, home safety risks, cognitive, and hearing and visual impairment  Diet and physical activities  Evidence for depression or mood disorders  The patient's weight, height, BMI, and visual acuity have been recorded in the chart. I have made referrals, counseling, and provided education to the patient based on review of the above and I have provided the patient with a written personalized care plan for preventive services.

## 2020-05-28 NOTE — Patient Instructions (Signed)
F/U 6 months  We will call with results

## 2020-05-29 LAB — CBC WITH DIFFERENTIAL/PLATELET
Absolute Monocytes: 593 cells/uL (ref 200–950)
Basophils Absolute: 11 cells/uL (ref 0–200)
Basophils Relative: 0.2 %
HCT: 40.7 % (ref 35.0–45.0)
Lymphs Abs: 1220 cells/uL (ref 850–3900)
MCH: 28.5 pg (ref 27.0–33.0)
MCHC: 33.7 g/dL (ref 32.0–36.0)
MCV: 84.6 fL (ref 80.0–100.0)
MPV: 10.8 fL (ref 7.5–12.5)
Neutro Abs: 3808 cells/uL (ref 1500–7800)
Platelets: 339 10*3/uL (ref 140–400)
Total Lymphocyte: 21.4 %
WBC: 5.7 10*3/uL (ref 3.8–10.8)

## 2020-05-29 LAB — LIPID PANEL
Cholesterol: 205 mg/dL — ABNORMAL HIGH (ref <200)
HDL: 63 mg/dL (ref >=50)
LDL Cholesterol (Calc): 124 mg/dL (calc) — ABNORMAL HIGH
Non-HDL Cholesterol (Calc): 142 mg/dL (calc) — ABNORMAL HIGH (ref <130)
Total CHOL/HDL Ratio: 3.3 (calc) (ref <5.0)
Triglycerides: 84 mg/dL (ref <150)

## 2020-05-29 LAB — COMPREHENSIVE METABOLIC PANEL
AG Ratio: 1.5 (calc) (ref 1.0–2.5)
ALT: 7 U/L (ref 6–29)
AST: 11 U/L (ref 10–35)
Albumin: 4.4 g/dL (ref 3.6–5.1)
Alkaline phosphatase (APISO): 65 U/L (ref 37–153)
BUN/Creatinine Ratio: 20 (calc) (ref 6–22)
BUN: 21 mg/dL (ref 7–25)
CO2: 25 mmol/L (ref 20–32)
Calcium: 9.6 mg/dL (ref 8.6–10.4)
Chloride: 101 mmol/L (ref 98–110)
Creat: 1.04 mg/dL — ABNORMAL HIGH (ref 0.60–0.93)
Globulin: 2.9 g/dL (calc) (ref 1.9–3.7)
Glucose, Bld: 97 mg/dL (ref 65–99)
Potassium: 4 mmol/L (ref 3.5–5.3)
Sodium: 140 mmol/L (ref 135–146)
Total Bilirubin: 0.5 mg/dL (ref 0.2–1.2)
Total Protein: 7.3 g/dL (ref 6.1–8.1)

## 2020-05-29 LAB — HEPATITIS C ANTIBODY
Hepatitis C Ab: NONREACTIVE
SIGNAL TO CUT-OFF: 0.02 (ref <1.00)

## 2020-06-03 ENCOUNTER — Other Ambulatory Visit: Payer: Self-pay | Admitting: *Deleted

## 2020-06-03 DIAGNOSIS — E785 Hyperlipidemia, unspecified: Secondary | ICD-10-CM

## 2020-06-03 MED ORDER — PRAVASTATIN SODIUM 20 MG PO TABS: 20.0000 mg | ORAL_TABLET | Freq: Every day | ORAL | 3 refills | Status: DC

## 2020-06-14 DIAGNOSIS — H2511 Age-related nuclear cataract, right eye: Secondary | ICD-10-CM | POA: Diagnosis not present

## 2020-08-07 IMAGING — MG DIGITAL SCREENING BILATERAL MAMMOGRAM WITH TOMO AND CAD
8 series · 8 of 24 positions shown · non-contrast
Comparison: Previous exam(s).

CLINICAL DATA: Screening.

EXAM:
DIGITAL SCREENING BILATERAL MAMMOGRAM WITH TOMO AND CAD

[L MLO synth-2D]
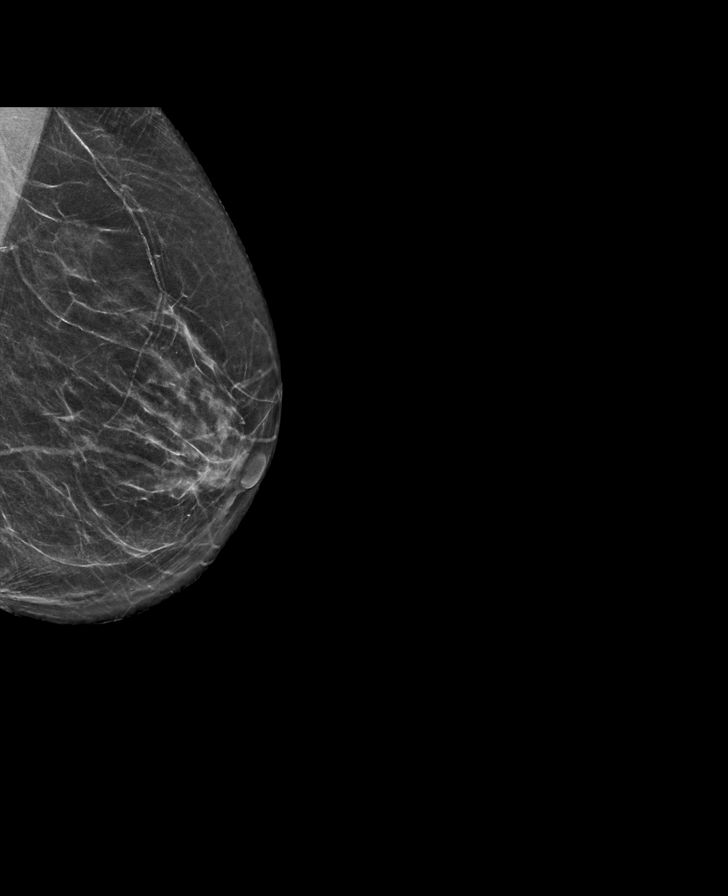

[R CC synth-2D]
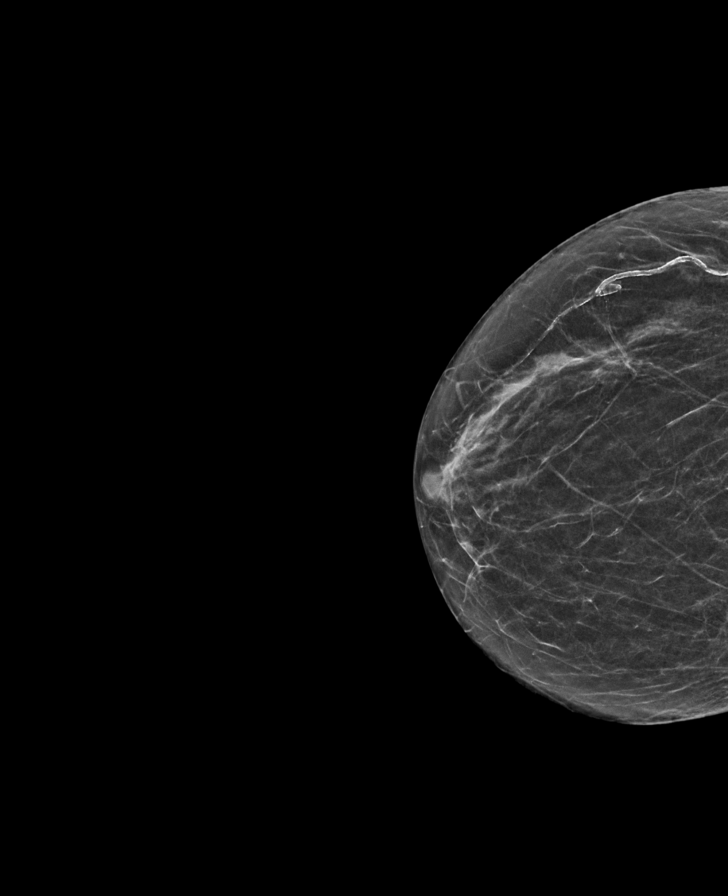

[R MLO synth-2D]
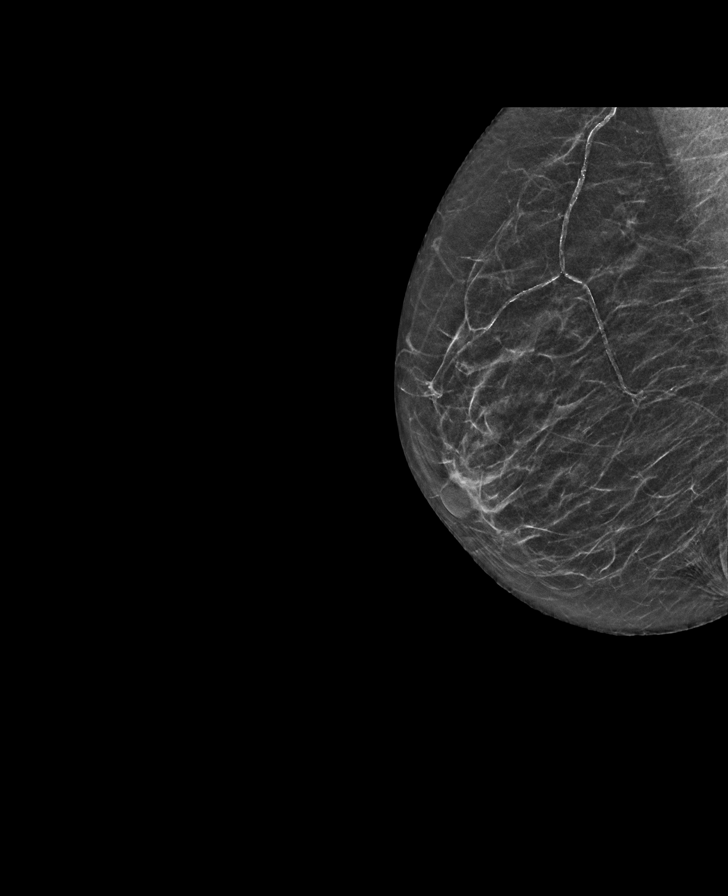

[L CC synth-2D]
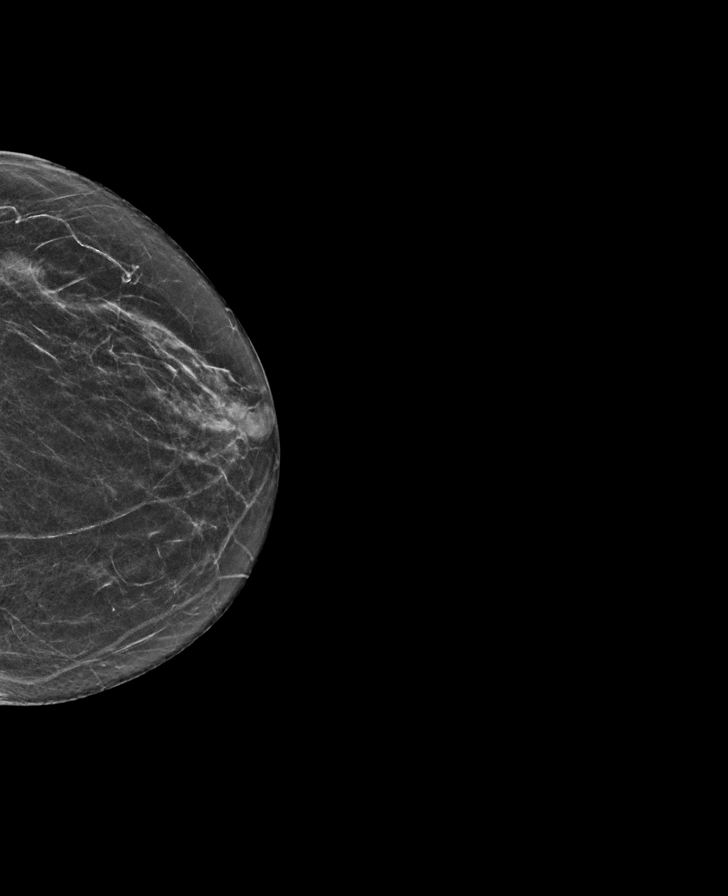

[R CC tomo · tomo slice 25/49.0]
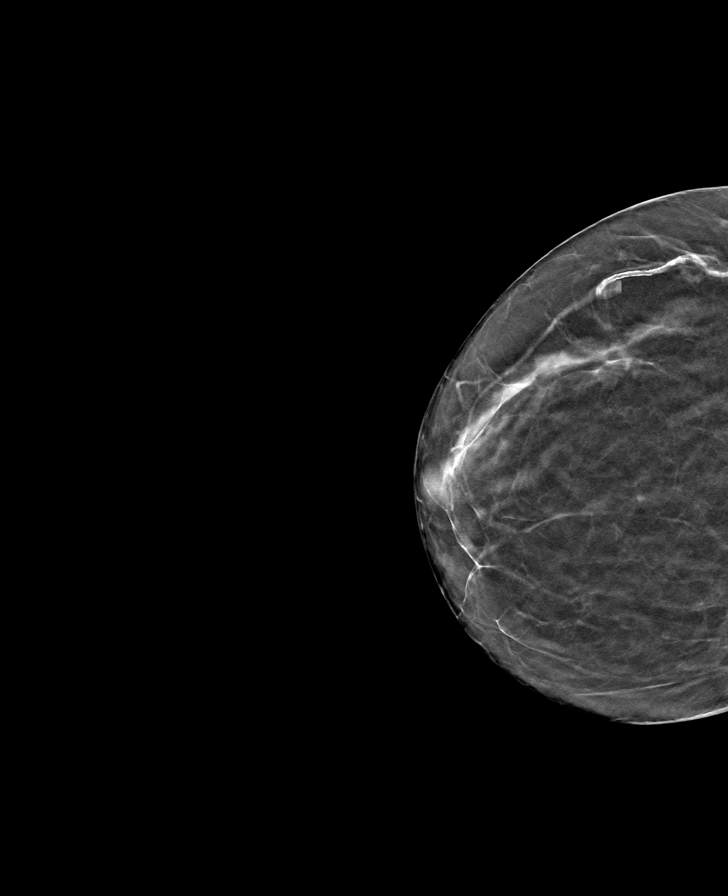

[R MLO tomo · tomo slice 24/47.0]
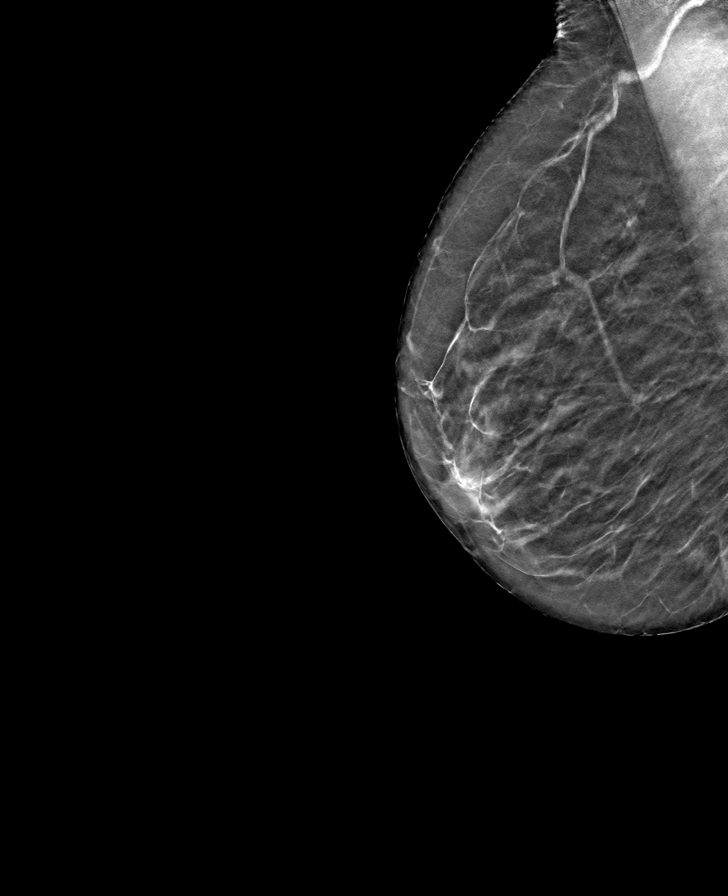

[L CC tomo · tomo slice 25/48.0]
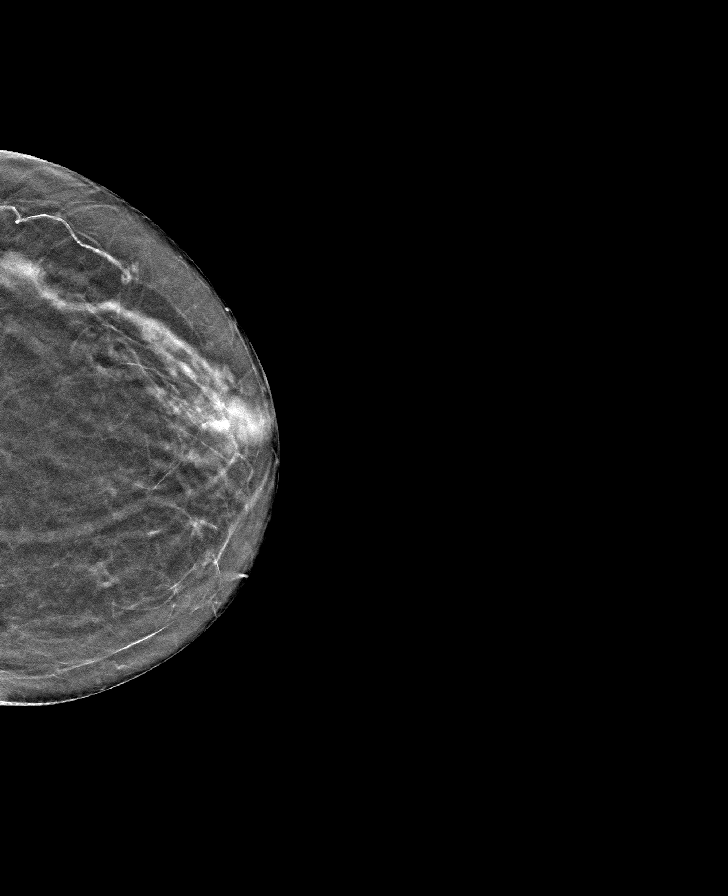

[L MLO tomo · tomo slice 27/54.0]
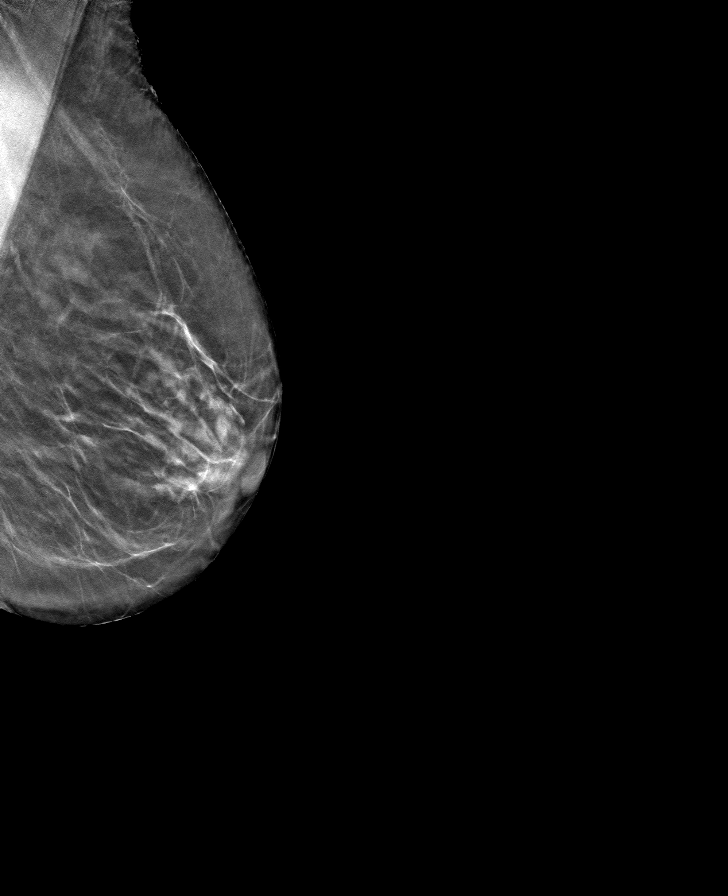

[8 of 24 positions shown; findings below may reference images not displayed]

ACR Breast Density Category b: There are scattered areas of
fibroglandular density.
FINDINGS: There are no findings suspicious for malignancy. Images were
processed with CAD.
IMPRESSION: No mammographic evidence of malignancy. A result letter of this
screening mammogram will be mailed directly to the patient.

RECOMMENDATION:
Screening mammogram in one year. (Code:[id number])

BI-RADS CATEGORY  1: Negative.

## 2020-08-21 ENCOUNTER — Other Ambulatory Visit: Payer: Self-pay | Admitting: Family Medicine

## 2020-09-16 ENCOUNTER — Other Ambulatory Visit: Payer: PPO

## 2020-09-16 ENCOUNTER — Other Ambulatory Visit: Payer: Self-pay

## 2020-09-17 ENCOUNTER — Other Ambulatory Visit: Payer: PPO

## 2020-09-17 DIAGNOSIS — E785 Hyperlipidemia, unspecified: Secondary | ICD-10-CM | POA: Diagnosis not present

## 2020-09-17 LAB — LIPID PANEL
Cholesterol: 176 mg/dL (ref <200)
HDL: 59 mg/dL (ref >=50)
LDL Cholesterol (Calc): 97 mg/dL (calc)
Non-HDL Cholesterol (Calc): 117 mg/dL (calc) (ref <130)
Total CHOL/HDL Ratio: 3 (calc) (ref <5.0)
Triglycerides: 100 mg/dL (ref <150)

## 2020-09-17 LAB — COMPLETE METABOLIC PANEL WITH GFR
AG Ratio: 1.3 (calc) (ref 1.0–2.5)
ALT: 7 U/L (ref 6–29)
AST: 13 U/L (ref 10–35)
Albumin: 4.3 g/dL (ref 3.6–5.1)
Alkaline phosphatase (APISO): 65 U/L (ref 37–153)
BUN/Creatinine Ratio: 13 (calc) (ref 6–22)
BUN: 17 mg/dL (ref 7–25)
CO2: 32 mmol/L (ref 20–32)
Calcium: 9.7 mg/dL (ref 8.6–10.4)
Chloride: 102 mmol/L (ref 98–110)
Creat: 1.28 mg/dL — ABNORMAL HIGH (ref 0.60–0.93)
GFR, Est African American: 47 mL/min/{1.73_m2} — ABNORMAL LOW (ref >=60)
GFR, Est Non African American: 40 mL/min/{1.73_m2} — ABNORMAL LOW (ref >=60)
Globulin: 3.3 g/dL (calc) (ref 1.9–3.7)
Glucose, Bld: 92 mg/dL (ref 65–99)
Potassium: 4.5 mmol/L (ref 3.5–5.3)
Sodium: 141 mmol/L (ref 135–146)
Total Bilirubin: 0.5 mg/dL (ref 0.2–1.2)
Total Protein: 7.6 g/dL (ref 6.1–8.1)

## 2020-10-18 ENCOUNTER — Encounter: Payer: Self-pay | Admitting: Family Medicine

## 2020-10-18 ENCOUNTER — Other Ambulatory Visit: Payer: Self-pay

## 2020-10-18 ENCOUNTER — Ambulatory Visit (INDEPENDENT_AMBULATORY_CARE_PROVIDER_SITE_OTHER): Payer: PPO | Admitting: Family Medicine

## 2020-10-18 VITALS — BP 112/64 | HR 88 | Temp 98.0°F | Resp 14 | Ht 62.0 in | Wt 119.0 lb

## 2020-10-18 DIAGNOSIS — I10 Essential (primary) hypertension: Secondary | ICD-10-CM | POA: Diagnosis not present

## 2020-10-18 DIAGNOSIS — N182 Chronic kidney disease, stage 2 (mild): Secondary | ICD-10-CM

## 2020-10-18 NOTE — Assessment & Plan Note (Signed)
Controlled, no dizziness, no chest pain If renal function still elevated will try to [facility] HCTZ and see if this improves renal function , she can monitor bp at home Continue norvasc

## 2020-10-18 NOTE — Patient Instructions (Addendum)
F/U Kristina Lucas in December for Physical

## 2020-10-18 NOTE — Progress Notes (Signed)
   Subjective:    Patient ID: Kristina Lucas, female    DOB: 19-Nov-1940, 80 y.o.   MRN: [id number]  Patient presents for Follow-up (Kidney function)  Pt here to f/u renal function and blood pressure   HTN- she drinks at least  4-5 cups off coffee , taking HCTZ and norvasc   no swelling  Last Cr 1.28, typically 1.1   Due for recheck today  Feels good , has a good appetite         Review Of Systems:  GEN- denies fatigue, fever, weight loss,weakness, recent illness HEENT- denies eye drainage, change in vision, nasal discharge, CVS- denies chest pain, palpitations RESP- denies SOB, cough, wheeze ABD- denies N/V, change in stools, abd pain GU- denies dysuria, hematuria, dribbling, incontinence MSK- denies joint pain, muscle aches, injury Neuro- denies headache, dizziness, syncope, seizure activity       Objective:    BP 112/64   Pulse 88   Temp 98 F (36.7 C) (Temporal)   Resp 14   Ht 5\' 2"  (1.575 m)   Wt 119 lb (54 kg)   SpO2 95%   BMI 21.77 kg/m  GEN- NAD, alert and oriented x3 HEENT- PERRL, EOMI, non injected sclera, pink conjunctiva, CVS- RRR, no murmur RESP-CTAB ABD-NABS,soft,NT,ND EXT- No edema Pulses- Radial, DP- 2+        Assessment & Plan:      Problem List Items Addressed This Visit      Unprioritized   CKD (chronic kidney disease) stage 2, GFR 60-89 ml/min   Hypertension - Primary    Controlled, no dizziness, no chest pain If renal function still elevated will try to D.C HCTZ and see if this improves renal function , she can monitor bp at home Continue norvasc       Relevant Orders   BASIC METABOLIC PANEL WITH GFR      Note: This dictation was prepared with Dragon dictation along with smaller phrase technology. Any transcriptional errors that result from this process are unintentional.

## 2020-10-19 LAB — BASIC METABOLIC PANEL WITH GFR
BUN/Creatinine Ratio: 15 (calc) (ref 6–22)
BUN: 18 mg/dL (ref 7–25)
CO2: 30 mmol/L (ref 20–32)
Calcium: 9.8 mg/dL (ref 8.6–10.4)
Chloride: 97 mmol/L — ABNORMAL LOW (ref 98–110)
Creat: 1.2 mg/dL — ABNORMAL HIGH (ref 0.60–0.93)
GFR, Est African American: 50 mL/min/{1.73_m2} — ABNORMAL LOW (ref >=60)
GFR, Est Non African American: 44 mL/min/{1.73_m2} — ABNORMAL LOW (ref >=60)
Glucose, Bld: 87 mg/dL (ref 65–99)
Potassium: 3.6 mmol/L (ref 3.5–5.3)
Sodium: 137 mmol/L (ref 135–146)

## 2020-11-20 ENCOUNTER — Encounter: Payer: Self-pay | Admitting: Nurse Practitioner

## 2020-11-20 ENCOUNTER — Other Ambulatory Visit: Payer: Self-pay

## 2020-11-20 ENCOUNTER — Ambulatory Visit (INDEPENDENT_AMBULATORY_CARE_PROVIDER_SITE_OTHER): Payer: PPO | Admitting: Nurse Practitioner

## 2020-11-20 VITALS — BP 140/70 | HR 94 | Temp 97.7°F | Ht 62.0 in | Wt 119.2 lb

## 2020-11-20 DIAGNOSIS — I10 Essential (primary) hypertension: Secondary | ICD-10-CM | POA: Diagnosis not present

## 2020-11-20 DIAGNOSIS — N182 Chronic kidney disease, stage 2 (mild): Secondary | ICD-10-CM | POA: Diagnosis not present

## 2020-11-20 NOTE — Progress Notes (Signed)
Subjective:    Patient ID: Kristina Lucas, female    DOB: [birth year], 80 y.o.   MRN: [id number]  HPI: Kristina Lucas is a 80 y.o. female presenting for kidney disease follow up.  Chief Complaint  Patient presents with  . Follow-up   HYPERTENSION Hypertension status: controlled  Satisfied with current treatment? no Duration of hypertension: chronic BP monitoring frequency:  rarely BP range: 120s on top, cannot remember bottom number BP medication side effects:  no Medication compliance: excellent Aspirin: no Recurrent headaches: no Visual changes: no Palpitations: no Dyspnea: no Chest pain: no Lower extremity edema: no Dizzy/lightheaded: no   CHRONIC KIDNEY DISEASE She has stopped HCTZ.  Is currently drinking about 32 oz of water daily, switched to decaf coffee.  Takes ibuprofen rarely - once every few months.  Does not take other OTC. CKD status: stable Medications renally dose: yes Previous renal evaluation: no Pneumovax:  Up to Date Influenza Vaccine:  Up to Date  No Known Allergies  Outpatient Encounter Medications as of 11/20/2020  Medication Sig  . albuterol (VENTOLIN HFA) 108 (90 Base) MCG/ACT inhaler Inhale 1-2 puffs into the lungs every 4 (four) hours as needed for wheezing or shortness of breath.  Marland Kitchen amLODipine (NORVASC) 10 MG tablet TAKE 1 TABLET (10 MG TOTAL) BY MOUTH DAILY.  . Calcium Carbonate-Vitamin D (CALCIUM + D PO) Take 1 tablet by mouth 2 (two) times daily with a meal.  . pravastatin (PRAVACHOL) 20 MG tablet Take 1 tablet (20 mg total) by mouth daily.  . [DISCONTINUED] hydrochlorothiazide (MICROZIDE) 12.5 MG capsule TAKE ONE CAPSULE BY MOUTH DAILY   No facility-administered encounter medications on file as of 11/20/2020.    Patient Active Problem List   Diagnosis Date Noted  . CKD (chronic kidney disease) stage 2, GFR 60-89 ml/min 10/18/2020  . HLD (hyperlipidemia) 06/03/2020  . Full code status 05/17/2019  . Osteopenia 04/24/2015  . History of  colonic polyps 04/24/2015  . DDD (degenerative disc disease), lumbosacral 07/12/2013  . COPD (chronic obstructive pulmonary disease) ([place])   . Right-sided low back pain with sciatica 03/27/2013  . Hypertension     Past Medical History:  Diagnosis Date  . COPD (chronic obstructive pulmonary disease) ([place])   . DDD (degenerative disc disease), lumbosacral 07/12/2013  . Hypertension   . Low back pain on right side with sciatica 03/27/2013    Relevant past medical, surgical, family and social history reviewed and updated as indicated. Interim medical history since our last visit reviewed.  Review of Systems Per HPI unless specifically indicated above     Objective:    BP 140/70   Pulse 94   Temp 97.7 F (36.5 C) (Temporal)   Wt 119 lb 3.2 oz (54.1 kg)   SpO2 97%   BMI 21.80 kg/m   Wt Readings from Last 3 Encounters:  11/20/20 119 lb 3.2 oz (54.1 kg)  10/18/20 119 lb (54 kg)  05/28/20 123 lb (55.8 kg)    Physical Exam Vitals and nursing note reviewed.  Constitutional:      General: She is not in acute distress.    Appearance: Normal appearance. She is not toxic-appearing.  HENT:     Head: Normocephalic and atraumatic.  Eyes:     General: No scleral icterus.    Extraocular Movements: Extraocular movements intact.  Cardiovascular:     Rate and Rhythm: Normal rate and regular rhythm.     Heart sounds: Normal heart sounds. No murmur heard.   Pulmonary:  Effort: Pulmonary effort is normal. No respiratory distress.     Breath sounds: Normal breath sounds. No wheezing, rhonchi or rales.  Musculoskeletal:        General: Normal range of motion.     Right lower leg: No edema.     Left lower leg: No edema.  Skin:    General: Skin is warm and dry.     Capillary Refill: Capillary refill takes less than 2 seconds.     Coloration: Skin is not jaundiced or pale.     Findings: No erythema.  Neurological:     Mental Status: She is alert and oriented to person, place, and  time.     Motor: No weakness.     Gait: Gait normal.  Psychiatric:        Mood and Affect: Mood is anxious.        Behavior: Behavior normal.        Thought Content: Thought content normal.        Judgment: Judgment normal.     Results for orders placed or performed in visit on 81/15/72  BASIC METABOLIC PANEL WITH GFR  Result Value Ref Range   Glucose, Bld 87 65 - 99 mg/dL   BUN 18 7 - 25 mg/dL   Creat 1.20 (H) 0.60 - 0.93 mg/dL   GFR, Est Non African American 44 (L) > OR = 60 mL/min/1.45m2   GFR, Est African American 50 (L) > OR = 60 mL/min/1.66m2   BUN/Creatinine Ratio 15 6 - 22 (calc)   Sodium 137 135 - 146 mmol/L   Potassium 3.6 3.5 - 5.3 mmol/L   Chloride 97 (L) 98 - 110 mmol/L   CO2 30 20 - 32 mmol/L   Calcium 9.8 8.6 - 10.4 mg/dL      Assessment & Plan:   Problem List Items Addressed This Visit      Cardiovascular and Mediastinum   Hypertension    Chronic.  BP at home at goal, today in clinic patient visibly anxious and BP slightly elevated.  Encouraged continuing to check BP at home and goal is less than 130/80.  Will recheck kidney function today given recently stopped HCTZ and continue to monitor BP at home.  Continue amlodipine 10 mg daily.  Follow up pending blood work - likely 3 months.        Genitourinary   CKD (chronic kidney disease) stage 2, GFR 60-89 ml/min - Primary    Chronic.  Last GFR 44.  Will recheck BMET with GFR today.  Discussed BP today and keeping BP in good control - less than 130/80 as treatment for her CKD.  Avoid nephrotoxins, NSAIDs, adequate water intake.  Follow up pending blood work.      Relevant Orders   BASIC METABOLIC PANEL WITH GFR       Follow up plan: No follow-ups on file.

## 2020-11-20 NOTE — Assessment & Plan Note (Addendum)
Chronic.  Last GFR 44.  Will recheck BMET with GFR today.  Discussed BP today and keeping BP in good control - less than 130/80 as treatment for her CKD.  Avoid nephrotoxins, NSAIDs, adequate water intake.  Follow up pending blood work.

## 2020-11-20 NOTE — Assessment & Plan Note (Signed)
Chronic.  BP at home at goal, today in clinic patient visibly anxious and BP slightly elevated.  Encouraged continuing to check BP at home and goal is less than 130/80.  Will recheck kidney function today given recently stopped HCTZ and continue to monitor BP at home.  Continue amlodipine 10 mg daily.  Follow up pending blood work - likely 3 months.

## 2020-11-20 NOTE — Patient Instructions (Signed)
Pending blood work

## 2020-11-21 LAB — BASIC METABOLIC PANEL WITH GFR
BUN/Creatinine Ratio: 14 (calc) (ref 6–22)
BUN: 13 mg/dL (ref 7–25)
CO2: 26 mmol/L (ref 20–32)
Calcium: 9.6 mg/dL (ref 8.6–10.4)
Chloride: 103 mmol/L (ref 98–110)
Creat: 0.94 mg/dL — ABNORMAL HIGH (ref 0.60–0.93)
GFR, Est African American: 68 mL/min/{1.73_m2} (ref >=60)
GFR, Est Non African American: 59 mL/min/{1.73_m2} — ABNORMAL LOW (ref >=60)
Glucose, Bld: 87 mg/dL (ref 65–99)
Potassium: 4 mmol/L (ref 3.5–5.3)
Sodium: 142 mmol/L (ref 135–146)

## 2020-12-07 ENCOUNTER — Other Ambulatory Visit: Payer: Self-pay

## 2020-12-07 ENCOUNTER — Emergency Department (HOSPITAL_COMMUNITY): Payer: PPO

## 2020-12-07 ENCOUNTER — Emergency Department (HOSPITAL_COMMUNITY)
Admission: EM | Admit: 2020-12-07 | Discharge: 2020-12-07 | Disposition: A | Discharge status: Home / Self Care | Payer: PPO | Attending: Emergency Medicine | Admitting: Emergency Medicine

## 2020-12-07 ENCOUNTER — Encounter (HOSPITAL_COMMUNITY): Payer: Self-pay | Admitting: Emergency Medicine

## 2020-12-07 DIAGNOSIS — N182 Chronic kidney disease, stage 2 (mild): Secondary | ICD-10-CM | POA: Diagnosis not present

## 2020-12-07 DIAGNOSIS — Y92009 Unspecified place in unspecified non-institutional (private) residence as the place of occurrence of the external cause: Secondary | ICD-10-CM | POA: Diagnosis not present

## 2020-12-07 DIAGNOSIS — W548XXA Other contact with dog, initial encounter: Secondary | ICD-10-CM | POA: Diagnosis not present

## 2020-12-07 DIAGNOSIS — I129 Hypertensive chronic kidney disease with stage 1 through stage 4 chronic kidney disease, or unspecified chronic kidney disease: Secondary | ICD-10-CM | POA: Diagnosis not present

## 2020-12-07 DIAGNOSIS — Z79899 Other long term (current) drug therapy: Secondary | ICD-10-CM | POA: Diagnosis not present

## 2020-12-07 DIAGNOSIS — M7989 Other specified soft tissue disorders: Secondary | ICD-10-CM | POA: Diagnosis not present

## 2020-12-07 DIAGNOSIS — J449 Chronic obstructive pulmonary disease, unspecified: Secondary | ICD-10-CM | POA: Insufficient documentation

## 2020-12-07 DIAGNOSIS — S59911A Unspecified injury of right forearm, initial encounter: Secondary | ICD-10-CM | POA: Diagnosis present

## 2020-12-07 DIAGNOSIS — Z87891 Personal history of nicotine dependence: Secondary | ICD-10-CM | POA: Insufficient documentation

## 2020-12-07 DIAGNOSIS — S51811A Laceration without foreign body of right forearm, initial encounter: Secondary | ICD-10-CM | POA: Diagnosis not present

## 2020-12-07 MED ORDER — CEPHALEXIN 500 MG PO CAPS: 500.0000 mg | ORAL_CAPSULE | Freq: Four times a day (QID) | ORAL | 0 refills | Status: DC

## 2020-12-07 MED ORDER — CEPHALEXIN 250 MG PO CAPS
500.0000 mg | ORAL_CAPSULE | Freq: Once | ORAL | Status: AC
  Administered 2020-12-07: 500 mg via ORAL
  Filled 2020-12-07: qty 2

## 2020-12-07 MED ORDER — LIDOCAINE-EPINEPHRINE (PF) 2 %-1:200000 IJ SOLN
10.0000 mL | Freq: Once | INTRAMUSCULAR | Status: AC
  Administered 2020-12-07: 10 mL
  Filled 2020-12-07: qty 20

## 2020-12-07 NOTE — ED Triage Notes (Addendum)
Large laceration/avulsion to R forearm from her son's dogs that jumped on her just PTA.  States it was from their claws and she did not get bit.  Denies pain.

## 2020-12-07 NOTE — Discharge Instructions (Signed)
Please read and follow all provided instructions.  Your diagnoses today include:  1. Laceration of right forearm, initial encounter     Tests performed today include:  X-ray of the affected area that did not show any foreign bodies or broken bones  Vital signs. See below for your results today.   Medications prescribed:   Keflex (cephalexin) - antibiotic  You have been prescribed an antibiotic medicine: take the entire course of medicine even if you are feeling better. Stopping early can cause the antibiotic not to work.  Take any prescribed medications only as directed.   Home care instructions:  Follow any educational materials and wound care instructions contained in this packet.   Keep affected area above the level of your heart when possible to minimize swelling. Wash area gently twice a day with warm soapy water. Do not apply alcohol or hydrogen peroxide. Cover the area if it draining or weeping.   Follow-up instructions: Suture Removal: Return to the Emergency Department or see your primary care care doctor in 10-14 days for a recheck of your wound and removal of your sutures or staples.    Return instructions:  Return to the Emergency Department if you have:  Fever  Worsening pain  Worsening swelling of the wound  Pus draining from the wound  Redness of the skin that moves away from the wound, especially if it streaks away from the affected area   Any other emergent concerns  Your vital signs today were: BP (!) 151/99   Pulse 95   Temp 98.5 F (36.9 C) (Oral)   Resp 17   SpO2 97%  If your blood pressure (BP) was elevated above 135/85 this visit, please have this repeated by your doctor within one month. --------------

## 2020-12-07 NOTE — ED Provider Notes (Signed)
[facility] EMERGENCY DEPARTMENT Provider Note   CSN: [id number] Arrival date & time: 12/07/20  1802     History Chief Complaint  Patient presents with  . Extremity Laceration    Kristina Lucas is a 80 y.o. female.  Patient with history of COPD and chronic kidney disease presents the emergency department today for evaluation of a laceration to the left forearm sustained just prior to arrival.  Patient was entering a house when the dogs ran at her and jumped up on her.  She sustained a scratch to the forearm which caused a large laceration.  Bleeding controlled prior to arrival.  Tetanus up-to-date.  Patient thinks that the dog's rabies was up-to-date.        Past Medical History:  Diagnosis Date  . COPD (chronic obstructive pulmonary disease) ([place])   . DDD (degenerative disc disease), lumbosacral 07/12/2013  . Hypertension   . Low back pain on right side with sciatica 03/27/2013    Patient Active Problem List   Diagnosis Date Noted  . CKD (chronic kidney disease) stage 2, GFR 60-89 ml/min 10/18/2020  . HLD (hyperlipidemia) 06/03/2020  . Full code status 05/17/2019  . Osteopenia 04/24/2015  . History of colonic polyps 04/24/2015  . DDD (degenerative disc disease), lumbosacral 07/12/2013  . COPD (chronic obstructive pulmonary disease) ([place])   . Right-sided low back pain with sciatica 03/27/2013  . Hypertension     Past Surgical History:  Procedure Laterality Date  . COLONOSCOPY    . FRACTURE SURGERY Right 01/2010   hip  . STERIOD INJECTION  05/30/13   lumbar     OB History   No obstetric history on file.     Family History  Problem Relation Age of Onset  . Cancer Mother 54       colon  . Colon cancer Mother   . Heart disease Father   . Hypertension Father   . Heart disease Brother   . Esophageal cancer Neg Hx   . Stomach cancer Neg Hx     Social History   Tobacco Use  . Smoking status: Former Smoker    Packs/day: 1.50    Quit  date: 04/17/2009    Years since quitting: 11.6  . Smokeless tobacco: Never Used  Substance Use Topics  . Alcohol use: No  . Drug use: No    Home Medications Prior to Admission medications   Medication Sig Start Date End Date Taking? Authorizing Provider  albuterol (VENTOLIN HFA) 108 (90 Base) MCG/ACT inhaler Inhale 1-2 puffs into the lungs every 4 (four) hours as needed for wheezing or shortness of breath. 11/20/19   [place], Modena Nunnery, MD  amLODipine (NORVASC) 10 MG tablet TAKE 1 TABLET (10 MG TOTAL) BY MOUTH DAILY. 11/20/19   Alycia Rossetti, MD  Calcium Carbonate-Vitamin D (CALCIUM + D PO) Take 1 tablet by mouth 2 (two) times daily with a meal.    [provider]  pravastatin (PRAVACHOL) 20 MG tablet Take 1 tablet (20 mg total) by mouth daily. 06/03/20   Alycia Rossetti, MD    Allergies    Patient has no known allergies.  Review of Systems   Review of Systems  Constitutional: Negative for activity change.  Musculoskeletal: Positive for myalgias. Negative for arthralgias, back pain, joint swelling and neck pain.  Skin: Positive for wound.  Neurological: Negative for weakness and numbness.    Physical Exam Updated Vital Signs BP (!) 151/99   Pulse 95   Temp  98.5 F (36.9 C) (Oral)   Resp 17   SpO2 97%   Physical Exam Vitals and nursing note reviewed.  Constitutional:      Appearance: She is well-developed.  HENT:     Head: Normocephalic and atraumatic.  Eyes:     Pupils: Pupils are equal, round, and reactive to light.  Cardiovascular:     Pulses: Normal pulses. No decreased pulses.  Musculoskeletal:        General: Tenderness present.     Cervical back: Normal range of motion and neck supple.     Comments: R forearm: 10 cm C-shaped flap laceration/skin care with retraction of the flap.  Wound base clean.  Extends to the muscle without significant fascial disruption.  Wound is hemostatic.  Skin edges are a bit ragged.  There is a small, approximately 8 mm  mildly gaping laceration more distally on the forearm.  Skin:    General: Skin is warm and dry.  Neurological:     Mental Status: She is alert.     Sensory: No sensory deficit.     Comments: Motor, sensation, and vascular distal to the injury is fully intact.      ED Results / Procedures / Treatments   Labs (all labs ordered are listed, but only abnormal results are displayed) Labs Reviewed - No data to display  EKG None  Radiology DG Forearm Right  Result Date: 12/07/2020 CLINICAL DATA:  Dog scratch to the posterior aspect of the mid forearm. EXAM: RIGHT FOREARM - 2 VIEW COMPARISON:  None. FINDINGS: There is no evidence of fracture or other focal bone lesions. There is soft tissue laceration and swelling involving the mid aspect of the forearm. No radiopaque foreign body. IMPRESSION: Soft tissue laceration and swelling without acute osseous injury. Electronically Signed   By: Zerita Boers M.D.   On: 12/07/2020 19:25    Procedures .Marland KitchenLaceration Repair  Date/Time: 12/07/2020 10:43 PM Performed by: Carlisle Cater, PA-C Authorized by: Carlisle Cater, PA-C   Consent:    Consent obtained:  Verbal   Consent given by:  Patient   Risks, benefits, and alternatives were discussed: yes     Risks discussed:  Infection, pain, poor cosmetic result, need for additional repair and nerve damage   Alternatives discussed:  No treatment Universal protocol:    Patient identity confirmed:  Verbally with patient, arm band and provided demographic data Anesthesia:    Anesthesia method:  Local infiltration   Local anesthetic:  Lidocaine 2% WITH epi Laceration details:    Location:  Shoulder/arm   Shoulder/arm location:  R lower arm   Length (cm):  10 Pre-procedure details:    Preparation:  Patient was prepped and draped in usual sterile fashion and imaging obtained to evaluate for foreign bodies Exploration:    Imaging obtained: x-ray     Imaging outcome: foreign body not noted     Wound  exploration: wound explored through full range of motion and entire depth of wound visualized     Wound extent: fascia violated (partial, not full thickness)     Wound extent: no muscle damage noted     Contaminated: no   Treatment:    Irrigation solution:  Sterile water   Irrigation volume:  1000   Visualized foreign bodies/material removed: no     Debridement:  Minimal   Undermining:  None Skin repair:    Repair method:  Sutures   Suture size:  4-0   Suture material:  Nylon   Suture  technique:  Simple interrupted   Number of sutures:  21 (3 additional retention sutures placed and removed) Approximation:    Approximation:  Close Repair type:    Repair type:  Complex Post-procedure details:    Dressing:  Bulky dressing   Procedure completion:  Tolerated well, no immediate complications     Medications Ordered in ED Medications  lidocaine-EPINEPHrine (XYLOCAINE W/EPI) 2 %-1:200000 (PF) injection 10 mL (has no administration in time range)  cephALEXin (KEFLEX) capsule 500 mg (500 mg Oral Given 12/07/20 2243)    ED Course  I have reviewed the triage vital signs and the nursing notes.  Pertinent labs & imaging results that were available during my care of the patient were reviewed by me and considered in my medical decision making (see chart for details).  Patient seen and examined. X-ray reviewed. Will need to anesthetize, irrigate well, close wound.  Vital signs reviewed and are as follows: BP (!) 151/99   Pulse 95   Temp 98.5 F (36.9 C) (Oral)   Resp 17   SpO2 97%   10:46 PM Wound repaired as above to the best of my ability given tension and laceration on the skin.  Recommended closure given large defect of the skin and retraction of the flap. Wound is fairly well approximated after repair taking care to avoid tearing skin with overtensioning as possible.   Bulky dressing placed.  Will give 5 days of Keflex.  Patient counseled on wound care. Patient counseled on need  to return or see PCP/urgent care for suture removal in 10-14 days. Patient was urged to return to the Emergency Department urgently with worsening pain, swelling, expanding erythema especially if it streaks away from the affected area, fever, or if they have any other concerns. Patient verbalized understanding.     MDM Rules/Calculators/A&P                          Patient with skin laceration as noted above.  Wound extends to the forearm musculature and fascia but no deep disruption.  No significant vascular or nerve disruption suspected.  No signs of compartment syndrome.  Follow-up and return instructions as above.   Final Clinical Impression(s) / ED Diagnoses Final diagnoses:  Laceration of right forearm, initial encounter    Rx / DC Orders ED Discharge Orders         Ordered    cephALEXin (KEFLEX) 500 MG capsule  4 times daily        12/07/20 2240           Carlisle Cater, Hershal Coria 12/07/20 2251    Luna Fuse, MD 12/07/20 2255    Luna Fuse, MD 12/07/20 2256

## 2020-12-07 NOTE — ED Triage Notes (Signed)
Emergency Medicine Provider Triage Evaluation Note  Kristina Lucas , a 80 y.o. female  was evaluated in triage.  Pt complains of right forearm wound that occurred shortly prior to arrival.  Patient states her daughter-in-law's dog jumped up on her and scraped her arm.  She did not sustain a bite.  She thinks the dog is up-to-date on rabies vaccination, but will find out for sure.  Tetanus vaccination updated 2015.  Review of Systems  Positive: Arm laceration Negative: Bone pain, pain with range of motion in the right arm or joints of the right upper extremity, numbness, weakness  Physical Exam  BP (!) 160/79 (BP Location: Left Arm)   Pulse 97   Temp 99.4 F (37.4 C) (Oral)   Resp 16   SpO2 97%   Gen:   Awake, no distress   HEENT:  Atraumatic  Resp:  Normal effort  Cardiac:  Normal rate  MSK:   Moves extremities without difficulty.  Full range of motion in the right shoulder, wrist, elbow. Neuro:  Speech clear, sensation to light touch intact in the upper extremities.  Grip strength equal bilaterally.  Strength 5/5 in the right wrist, elbow, shoulder.             Medical Decision Making  Medically screening exam initiated at 6:07 PM.  Appropriate orders placed.  Kristina Lucas was informed that the remainder of the evaluation will be completed by another provider, this initial triage assessment does not replace that evaluation, and the importance of remaining in the ED until their evaluation is complete.  Clinical Impression   Patient has a laceration that appears to as deep as the muscle fascia without noted fascial violation.     Lorayne Bender, PA-C 12/07/20 1820

## 2020-12-07 NOTE — ED Notes (Signed)
Pa at bedside placing sutures. Pt in [place].

## 2020-12-23 ENCOUNTER — Encounter: Payer: Self-pay | Admitting: Nurse Practitioner

## 2020-12-23 ENCOUNTER — Other Ambulatory Visit: Payer: Self-pay

## 2020-12-23 ENCOUNTER — Ambulatory Visit (INDEPENDENT_AMBULATORY_CARE_PROVIDER_SITE_OTHER): Payer: PPO | Admitting: Nurse Practitioner

## 2020-12-23 VITALS — BP 140/76 | HR 97 | Temp 98.1°F | Ht 62.0 in | Wt 118.8 lb

## 2020-12-23 DIAGNOSIS — S41111A Laceration without foreign body of right upper arm, initial encounter: Secondary | ICD-10-CM

## 2020-12-23 DIAGNOSIS — Z4802 Encounter for removal of sutures: Secondary | ICD-10-CM | POA: Diagnosis not present

## 2020-12-23 DIAGNOSIS — S41111S Laceration without foreign body of right upper arm, sequela: Secondary | ICD-10-CM

## 2020-12-23 NOTE — Progress Notes (Signed)
Subjective:    Patient ID: Kristina Lucas, female    DOB: 30-Apr-1941, 80 y.o.   MRN: [id number]  HPI: Kristina Lucas is a 80 y.o. female presenting for  Chief Complaint  Patient presents with  . Suture / Staple Removal    Right are stitch removal, itches a little   SKIN INFECTION Patient reports her son's dog jumped on her about 16 days ago.  She went to the ER and had 21 stitches put in her arm to close the laceration.  She denies any complications with the stitches, they are not oozing, draining, they are not painful, and there is no redness. Duration: days Location: right arm History of trauma in area: yes Pain: no Quality: no Severity: not painful Redness: no Swelling: no Oozing: no Pus: no Fevers: no Nausea/vomiting: no Status: better Treatments attempted: washing and neosporin  Tetanus: UTD  No Known Allergies  Outpatient Encounter Medications as of 12/23/2020  Medication Sig  . albuterol (VENTOLIN HFA) 108 (90 Base) MCG/ACT inhaler Inhale 1-2 puffs into the lungs every 4 (four) hours as needed for wheezing or shortness of breath.  Marland Kitchen amLODipine (NORVASC) 10 MG tablet TAKE 1 TABLET (10 MG TOTAL) BY MOUTH DAILY.  . Calcium Carbonate-Vitamin D (CALCIUM + D PO) Take 1 tablet by mouth 2 (two) times daily with a meal.  . cephALEXin (KEFLEX) 500 MG capsule Take 1 capsule (500 mg total) by mouth 4 (four) times daily.  . pravastatin (PRAVACHOL) 20 MG tablet Take 1 tablet (20 mg total) by mouth daily.   No facility-administered encounter medications on file as of 12/23/2020.    Patient Active Problem List   Diagnosis Date Noted  . CKD (chronic kidney disease) stage 2, GFR 60-89 ml/min 10/18/2020  . HLD (hyperlipidemia) 06/03/2020  . Full code status 05/17/2019  . Osteopenia 04/24/2015  . History of colonic polyps 04/24/2015  . DDD (degenerative disc disease), lumbosacral 07/12/2013  . COPD (chronic obstructive pulmonary disease) ([place])   . Right-sided low back pain with  sciatica 03/27/2013  . Hypertension     Past Medical History:  Diagnosis Date  . COPD (chronic obstructive pulmonary disease) ([place])   . DDD (degenerative disc disease), lumbosacral 07/12/2013  . Hypertension   . Low back pain on right side with sciatica 03/27/2013    Relevant past medical, surgical, family and social history reviewed and updated as indicated. Interim medical history since our last visit reviewed.  Review of Systems Per HPI unless specifically indicated above     Objective:    BP 140/76   Pulse 97   Temp 98.1 F (36.7 C)   Ht 5\' 2"  (1.575 m)   Wt 118 lb 12.8 oz (53.9 kg)   SpO2 100%   BMI 21.73 kg/m   Wt Readings from Last 3 Encounters:  12/23/20 118 lb 12.8 oz (53.9 kg)  11/20/20 119 lb 3.2 oz (54.1 kg)  10/18/20 119 lb (54 kg)    Physical Exam Vitals and nursing note reviewed.  Constitutional:      General: She is not in acute distress.    Appearance: Normal appearance. She is not toxic-appearing.  Skin:    General: Skin is warm and dry.     Capillary Refill: Capillary refill takes less than 2 seconds.     Coloration: Skin is not jaundiced or pale.     Findings: No erythema.          Comments: Closed laceration noted to right posterior forearm;  sutures in place with some scabbing.  No warmth, redness, swelling, drainage, or odor.  Neurological:     Mental Status: She is alert and oriented to person, place, and time.     Motor: No weakness.     Gait: Gait normal.  Psychiatric:        Mood and Affect: Mood normal.        Behavior: Behavior normal.        Thought Content: Thought content normal.        Judgment: Judgment normal.       Assessment & Plan:  1. Arm laceration, right, sequela Acute.  Healing well.  21 sutures removed without complication.  Patient tolerated well.  0 mL EBL.  Wound covered with triple antibiotic and gauze and secured.  Wound care instruction discussed.    2. Visit for suture removal      Follow up  plan: Return for as scheduled.

## 2021-06-09 ENCOUNTER — Telehealth: Payer: Self-pay | Admitting: Nurse Practitioner

## 2021-06-09 MED ORDER — AMLODIPINE BESYLATE 10 MG PO TABS: ORAL_TABLET | ORAL | 2 refills | Status: DC

## 2021-06-09 NOTE — Telephone Encounter (Signed)
Prescription sent to pharmacy.

## 2021-06-09 NOTE — Telephone Encounter (Signed)
Patient called to follow up on pharmacy's refill request for amLODipine (NORVASC) 10 MG tablet [[id number]]   Pharmacy confirmed as  [facility] [id number] - [place], [place] [address] [place]  [address] [place], [place] [address]  Phone:  [phone]  Fax:  [phone]   Patient only has enough pills to last until 10/25.  Please advise at [phone]

## 2021-07-17 ENCOUNTER — Encounter: Payer: Self-pay | Admitting: Nurse Practitioner

## 2021-07-17 ENCOUNTER — Ambulatory Visit (INDEPENDENT_AMBULATORY_CARE_PROVIDER_SITE_OTHER): Payer: PPO | Admitting: Nurse Practitioner

## 2021-07-17 ENCOUNTER — Other Ambulatory Visit: Payer: Self-pay

## 2021-07-17 VITALS — BP 170/86 | HR 71 | Temp 98.2°F | Resp 16 | Ht 62.0 in | Wt 121.8 lb

## 2021-07-17 DIAGNOSIS — Z0000 Encounter for general adult medical examination without abnormal findings: Secondary | ICD-10-CM | POA: Diagnosis not present

## 2021-07-17 DIAGNOSIS — E559 Vitamin D deficiency, unspecified: Secondary | ICD-10-CM | POA: Diagnosis not present

## 2021-07-17 DIAGNOSIS — J439 Emphysema, unspecified: Secondary | ICD-10-CM

## 2021-07-17 DIAGNOSIS — I10 Essential (primary) hypertension: Secondary | ICD-10-CM

## 2021-07-17 DIAGNOSIS — E785 Hyperlipidemia, unspecified: Secondary | ICD-10-CM

## 2021-07-17 LAB — COMPLETE METABOLIC PANEL WITH GFR
AG Ratio: 1.5 (calc) (ref 1.0–2.5)
ALT: 7 U/L (ref 6–29)
AST: 14 U/L (ref 10–35)
Albumin: 4.5 g/dL (ref 3.6–5.1)
Alkaline phosphatase (APISO): 61 U/L (ref 37–153)
BUN/Creatinine Ratio: 13 (calc) (ref 6–22)
BUN: 14 mg/dL (ref 7–25)
CO2: 28 mmol/L (ref 20–32)
Calcium: 9.6 mg/dL (ref 8.6–10.4)
Chloride: 105 mmol/L (ref 98–110)
Creat: 1.12 mg/dL — ABNORMAL HIGH (ref 0.60–1.00)
Globulin: 3 g/dL (calc) (ref 1.9–3.7)
Glucose, Bld: 99 mg/dL (ref 65–99)
Potassium: 4.6 mmol/L (ref 3.5–5.3)
Sodium: 142 mmol/L (ref 135–146)
Total Bilirubin: 0.6 mg/dL (ref 0.2–1.2)
Total Protein: 7.5 g/dL (ref 6.1–8.1)
eGFR: 50 mL/min/{1.73_m2} — ABNORMAL LOW (ref >=60)

## 2021-07-17 LAB — CBC WITH DIFFERENTIAL/PLATELET
Absolute Monocytes: 506 cells/uL (ref 200–950)
Basophils Absolute: 13 cells/uL (ref 0–200)
Basophils Relative: 0.2 %
Eosinophils Absolute: 51 cells/uL (ref 15–500)
Eosinophils Relative: 0.8 %
HCT: 41.5 % (ref 35.0–45.0)
Hemoglobin: 14 g/dL (ref 11.7–15.5)
Lymphs Abs: 1126 cells/uL (ref 850–3900)
MCH: 29.2 pg (ref 27.0–33.0)
MCHC: 33.7 g/dL (ref 32.0–36.0)
MCV: 86.6 fL (ref 80.0–100.0)
MPV: 10.9 fL (ref 7.5–12.5)
Monocytes Relative: 7.9 %
Neutro Abs: 4704 cells/uL (ref 1500–7800)
Neutrophils Relative %: 73.5 %
Platelets: 298 10*3/uL (ref 140–400)
RBC: 4.79 10*6/uL (ref 3.80–5.10)
RDW: 12.9 % (ref 11.0–15.0)
Total Lymphocyte: 17.6 %
WBC: 6.4 10*3/uL (ref 3.8–10.8)

## 2021-07-17 LAB — VITAMIN D 25 HYDROXY (VIT D DEFICIENCY, FRACTURES): Vit D, 25-Hydroxy: 21 ng/mL — ABNORMAL LOW (ref 30–100)

## 2021-07-17 LAB — LIPID PANEL
Cholesterol: 189 mg/dL (ref <200)
HDL: 59 mg/dL (ref >=50)
LDL Cholesterol (Calc): 110 mg/dL (calc) — ABNORMAL HIGH
Non-HDL Cholesterol (Calc): 130 mg/dL (calc) — ABNORMAL HIGH (ref <130)
Total CHOL/HDL Ratio: 3.2 (calc) (ref <5.0)
Triglycerides: 102 mg/dL (ref <150)

## 2021-07-17 MED ORDER — ALBUTEROL SULFATE HFA 108 (90 BASE) MCG/ACT IN AERS: 1.0000 | INHALATION_SPRAY | RESPIRATORY_TRACT | 1 refills | Status: DC | PRN

## 2021-07-17 NOTE — Progress Notes (Signed)
Patient: Kristina Lucas, Female    DOB: 05-19-1941, 80 y.o.   MRN: [id number]  Visit Date: 07/17/2021  Today's Provider: Eulogio Bear, NP   Chief Complaint  Patient presents with   Annual Exam     Subjective:   Kristina Lucas is a 80 y.o. female who presents today for her Subsequent Annual Wellness Visit.  Care Team:   [facility]- [name], MD Primary [facility] [place], NP  HPI  Patient reports things are going well for her.  She takes amlodipine 10 mg daily in the morning.  She did not take this yet today.  She reports her blood pressure at home is normally in the 130s over 70s range.  She denies chest pain, dizziness, lightheadedness, palpitations, swelling in her legs.  She is also taking pravastatin 20 mg daily for high cholesterol.  She is tolerating this medication well.  No issues with myalgias, chest pain, dyspnea with exertion.  She has not been taking calcium with vitamin D-she ran out a few months ago and has not picked up.  She has not been using albuterol very often, thinks it might be close to expiration and is requesting a refill today.  We discussed colon cancer screening at length today.  It looks like her last colonoscopy was in 2019 and at that time it was recommended to have a repeat colonoscopy given the significant findings and despite her age being greater than 50.   Review of Systems Per HPI unless otherwise stated  Past Medical History:  Diagnosis Date   COPD (chronic obstructive pulmonary disease) (H[facility])    DDD (degenerative disc disease), lumbosacral 07/12/2013   Hypertension    Low back pain on right side with sciatica 03/27/2013    Past Surgical History:  Procedure Laterality Date   COLONOSCOPY     FRACTURE SURGERY Right 01/2010   hip   STERIOD INJECTION  05/30/13   lumbar    Family History  Problem Relation Age of Onset   Cancer Mother 21       colon   Colon cancer Mother    Heart disease  Father    Hypertension Father    Heart disease Brother    Hypertension Son    Esophageal cancer Neg Hx    Stomach cancer Neg Hx     Social History   Socioeconomic History   Marital status: Married    Spouse name: [name]   Number of children: 2   Years of education: 12   Highest education level: 12th grade  Occupational History   Occupation: retired   Occupation: Armed forces logistics/support/administrative officer  Tobacco Use   Smoking status: Former    Packs/day: 1.50    Types: Cigarettes    Quit date: 04/17/2009    Years since quitting: 12.2   Smokeless tobacco: Never  Vaping Use   Vaping Use: Never used  Substance and Sexual Activity   Alcohol use: No   Drug use: No   Sexual activity: Yes    Birth control/protection: None  Other Topics Concern   Not on file  Social History Narrative   Married.    2 Children. Both Boys.   First Baby Died at 2 months old of an infection.   After had Third Baby stayed home for 15 years until her boys were grown..    Social Determinants of Health   Financial Resource Strain: Not on file  Food Insecurity: Not on file  Transportation Needs: Not on file  Physical  Activity: Not on file  Stress: Not on file  Social Connections: Not on file  Intimate Partner Violence: Not on file    Outpatient Encounter Medications as of 07/17/2021  Medication Sig   amLODipine (NORVASC) 10 MG tablet TAKE 1 TABLET (10 MG TOTAL) BY MOUTH DAILY.   pravastatin (PRAVACHOL) 20 MG tablet Take 1 tablet (20 mg total) by mouth daily.   [DISCONTINUED] albuterol (VENTOLIN HFA) 108 (90 Base) MCG/ACT inhaler Inhale 1-2 puffs into the lungs every 4 (four) hours as needed for wheezing or shortness of breath.   albuterol (VENTOLIN HFA) 108 (90 Base) MCG/ACT inhaler Inhale 1-2 puffs into the lungs every 4 (four) hours as needed for wheezing or shortness of breath.   Calcium Carbonate-Vitamin D (CALCIUM + D PO) Take 1 tablet by mouth 2 (two) times daily with a meal. (Patient not taking: Reported on 07/17/2021)    [DISCONTINUED] cephALEXin (KEFLEX) 500 MG capsule Take 1 capsule (500 mg total) by mouth 4 (four) times daily.   No facility-administered encounter medications on file as of 07/17/2021.    Functional Status Survey: Is the patient deaf or have difficulty hearing?: No Does the patient have difficulty seeing, even when wearing glasses/contacts?: No Does the patient have difficulty concentrating, remembering, or making decisions?: No Does the patient have difficulty walking or climbing stairs?: No Does the patient have difficulty dressing or bathing?: No Does the patient have difficulty doing errands alone such as visiting a doctor's office or shopping?: No   Fall Risk Assessment Fall Risk  07/17/2021 07/17/2021 12/23/2020 05/28/2020 11/20/2019  Falls in the past year? 0 0 0 0 0  Number falls in past yr: 0 0 0 - -  Injury with Fall? 0 0 0 - -  Risk for fall due to : - - - No Fall Risks No Fall Risks  Follow up - Falls prevention discussed - Falls evaluation completed Falls evaluation completed    Depression Screen Depression screen [facility] 2/9 07/17/2021 05/28/2020 11/20/2019 05/17/2019 10/31/2018  Decreased Interest 0 0 0 0 0  Down, Depressed, Hopeless 0 0 0 0 0  PHQ - 2 Score 0 0 0 0 0  Altered sleeping 0 - - - -  Tired, decreased energy 0 - - - -  Change in appetite 0 - - - -  Feeling bad or failure about yourself  0 - - - -  Trouble concentrating 0 - - - -  Moving slowly or fidgety/restless 0 - - - -  Suicidal thoughts 0 - - - -  PHQ-9 Score 0 - - - -  Difficult doing work/chores - - - - -    6CIT Screen 07/17/2021 04/28/2018  What Year? 0 points 0 points  What month? 0 points 0 points  What time? 0 points 0 points  Count back from 20 0 points 0 points  Months in reverse 2 points 0 points  Repeat phrase 0 points 2 points  Total Score 2 2    Advanced Directives Does patient have a HCPOA?  No; advance Rectiv packet given today If yes, name and contact information: husband, then son  Chasidy Janak). Does patient have a living will or MOST form?  no  Objective:   Vitals: BP (!) 170/86   Pulse 71   Temp 98.2 F (36.8 C)   Resp 16   Ht 5\' 2"  (1.575 m)   Wt 121 lb 12.8 oz (55.2 kg)   SpO2 97%   BMI 22.28 kg/m  Body  mass index is 22.28 kg/m. No results found.  Physical Exam Vitals and nursing note reviewed.  Constitutional:      General: She is not in acute distress.    Appearance: Normal appearance. She is not toxic-appearing.  HENT:     Head: Normocephalic and atraumatic.  Eyes:     General: No scleral icterus.    Extraocular Movements: Extraocular movements intact.  Neck:     Vascular: No carotid bruit.  Cardiovascular:     Rate and Rhythm: Normal rate and regular rhythm.     Heart sounds: Normal heart sounds. No murmur heard. Pulmonary:     Effort: Pulmonary effort is normal. No respiratory distress.     Breath sounds: Normal breath sounds. No wheezing, rhonchi or rales.  Abdominal:     General: Abdomen is flat. Bowel sounds are normal.     Palpations: Abdomen is soft.  Musculoskeletal:     Cervical back: Normal range of motion.  Skin:    General: Skin is warm and dry.     Capillary Refill: Capillary refill takes less than 2 seconds.     Coloration: Skin is not jaundiced or pale.     Findings: No erythema.  Neurological:     Mental Status: She is alert and oriented to person, place, and time.     Motor: No weakness.     Gait: Gait normal.  Psychiatric:        Mood and Affect: Mood normal.        Behavior: Behavior normal.        Thought Content: Thought content normal.        Judgment: Judgment normal.     Assessment & Plan:    Annual Wellness Visit  Reviewed patient's Family Medical History  Reviewed and updated list of patient's medical providers  Assessment of cognitive impairment was done  Assessed patient's functional ability Established a written schedule for health screening [place] Completed  and Reviewed  Health Maintenance reviewed -I encouraged her to get a COVID-19 booster.  I also encouraged the shingles vaccine, however she declines.  We discussed colon cancer screening at length.  She is not sure that she wants to have another colonoscopy given her age.  She is not having any new issues.  She worries that her mother had colon cancer in her 21s and passed away after undergoing treatment.  She does not want to suffer like her mother did.  She ultimately decided that she would like to hold off on colonoscopy for now, and I feel this is reasonable.  We will rediscuss at her next follow-up in 6 months.  In the meantime, if she starts having any new gastrointestinal symptoms, she will let us know.  Immunization History  Administered Date(s) Administered   Fluad Quad(high Dose 65+) 06/06/2020   Influenza Whole 05/24/2013   Influenza, High Dose Seasonal PF 05/10/2014, 05/18/2018   Influenza, Quadrivalent, Recombinant, Inj, Pf 04/26/2019   Influenza-Unspecified 05/18/2015, 04/28/2017, 06/09/2021   PFIZER(Purple Top)SARS-COV-2 Vaccination 08/27/2019, 09/27/2019, 07/01/2020   Pneumococcal Conjugate-13 04/18/2014   Pneumococcal Polysaccharide-23 04/12/2013   Tdap 10/16/2013    Health Maintenance  Topic Date Due   COVID-19 Vaccine (4 - Booster for Pfizer series) 08/26/2020   Zoster Vaccines- Shingrix (1 of 2) 10/15/2021 (Originally 06/06/1993)   COLONOSCOPY (Pts 45-38yrs Insurance coverage will need to be confirmed)  07/17/2022 (Originally 07/06/2021)   TETANUS/TDAP  10/17/2023   Pneumonia Vaccine 63+ Years old  Completed   INFLUENZA  VACCINE  Completed   DEXA SCAN  Completed   Hepatitis C Screening  Completed   HPV VACCINES  Aged Out    Discussed health benefits of physical activity, and encouraged her to engage in regular exercise appropriate for her age and condition.   Meds ordered this encounter  Medications   albuterol (VENTOLIN HFA) 108 (90 Base) MCG/ACT inhaler     Sig: Inhale 1-2 puffs into the lungs every 4 (four) hours as needed for wheezing or shortness of breath.    Dispense:  18 g    Refill:  1    Order Specific Question:   Supervising Provider    Answer:   Jenna Luo T [[id number]]     Current Outpatient Medications:    amLODipine (NORVASC) 10 MG tablet, TAKE 1 TABLET (10 MG TOTAL) BY MOUTH DAILY., Disp: 90 tablet, Rfl: 2   pravastatin (PRAVACHOL) 20 MG tablet, Take 1 tablet (20 mg total) by mouth daily., Disp: 90 tablet, Rfl: 3   albuterol (VENTOLIN HFA) 108 (90 Base) MCG/ACT inhaler, Inhale 1-2 puffs into the lungs every 4 (four) hours as needed for wheezing or shortness of breath., Disp: 18 g, Rfl: 1   Calcium Carbonate-Vitamin D (CALCIUM + D PO), Take 1 tablet by mouth 2 (two) times daily with a meal. (Patient not taking: Reported on 07/17/2021), Disp: , Rfl:  Medications Discontinued During This Encounter  Medication Reason   cephALEXin (KEFLEX) 500 MG capsule    albuterol (VENTOLIN HFA) 108 (90 Base) MCG/ACT inhaler Reorder    Next Medicare Wellness Visit in 12+ months

## 2021-07-18 ENCOUNTER — Other Ambulatory Visit: Payer: Self-pay

## 2021-07-18 MED ORDER — PRAVASTATIN SODIUM 40 MG PO TABS: 40.0000 mg | ORAL_TABLET | Freq: Every day | ORAL | 3 refills | Status: DC

## 2021-08-03 ENCOUNTER — Encounter: Payer: Self-pay | Admitting: Internal Medicine

## 2021-11-16 ENCOUNTER — Other Ambulatory Visit: Payer: Self-pay | Admitting: Nurse Practitioner

## 2021-11-17 NOTE — Telephone Encounter (Signed)
Pharmacy sent script via eFax to request refill authorization of  ? ?pravastatin (PRAVACHOL) 40 MG tablet [[id number]]  ? ?Efax received from: ? ?[facility] [id number] [place], [place]  ?[address] [place], [place] [address]  ?Phone:  [phone]  Fax:  [phone]  ? ?Please advise pharmacist.  ?

## 2022-01-15 ENCOUNTER — Ambulatory Visit: Payer: PPO | Admitting: Nurse Practitioner

## 2022-02-12 ENCOUNTER — Ambulatory Visit (INDEPENDENT_AMBULATORY_CARE_PROVIDER_SITE_OTHER): Payer: PPO | Admitting: Internal Medicine

## 2022-02-12 ENCOUNTER — Encounter: Payer: Self-pay | Admitting: Internal Medicine

## 2022-02-12 VITALS — BP 150/70 | HR 80 | Temp 97.9°F | Ht 63.0 in | Wt 123.6 lb

## 2022-02-12 DIAGNOSIS — I10 Essential (primary) hypertension: Secondary | ICD-10-CM | POA: Diagnosis not present

## 2022-02-12 DIAGNOSIS — N1831 Chronic kidney disease, stage 3a: Secondary | ICD-10-CM | POA: Diagnosis not present

## 2022-02-12 DIAGNOSIS — K635 Polyp of colon: Secondary | ICD-10-CM

## 2022-02-12 DIAGNOSIS — E785 Hyperlipidemia, unspecified: Secondary | ICD-10-CM

## 2022-02-12 DIAGNOSIS — Z13820 Encounter for screening for osteoporosis: Secondary | ICD-10-CM

## 2022-02-12 DIAGNOSIS — Z780 Asymptomatic menopausal state: Secondary | ICD-10-CM

## 2022-02-12 DIAGNOSIS — Z1211 Encounter for screening for malignant neoplasm of colon: Secondary | ICD-10-CM

## 2022-02-12 NOTE — Progress Notes (Signed)
New Patient Office Visit     CC/Reason for Visit: Establish care, discuss chronic medical conditions Previous PCP: Noemi Chapel, NP Last Visit: December/2022  HPI: Kristina Lucas is a 81 y.o. female who is coming in today for the above mentioned reasons. Past Medical History is significant for: Hypertension, hyperlipidemia, COPD.  She feels well today and has no acute concerns or complaints.  She is a former smoker of a pack a day who quit about 14 years ago.  She does not drink alcohol.  She has no known drug allergies.  Her past surgical history is significant for right hip repair, her mother passed away from colon cancer in her 52s.  Her last colonoscopy was in 2019 where she was found to have at least 5 adenomatous polyps and a 3-year follow-up was recommended, she is now overdue.  She does not think she will be getting her shingles vaccines, all other immunizations are up-to-date.  She is overdue for DEXA scan, she declines Pap smears due to her age.   Past Medical/Surgical History: Past Medical History:  Diagnosis Date   COPD (chronic obstructive pulmonary disease) ([place])    DDD (degenerative disc disease), lumbosacral 07/12/2013   Hypertension    Low back pain on right side with sciatica 03/27/2013    Past Surgical History:  Procedure Laterality Date   COLONOSCOPY     FRACTURE SURGERY Right 01/2010   hip   STERIOD INJECTION  05/30/13   lumbar    Social History:  reports that she quit smoking about 12 years ago. Her smoking use included cigarettes. She smoked an average of 1.5 packs per day. She has never used smokeless tobacco. She reports that she does not drink alcohol and does not use drugs.  Allergies: No Known Allergies  Family History:  Family History  Problem Relation Age of Onset   Cancer Mother 55       colon   Colon cancer Mother    Heart disease Father    Hypertension Father    Heart disease Brother    Hypertension Son    Esophageal cancer Neg Hx     Stomach cancer Neg Hx      Current Outpatient Medications:    albuterol (VENTOLIN HFA) 108 (90 Base) MCG/ACT inhaler, Inhale 1-2 puffs into the lungs every 4 (four) hours as needed for wheezing or shortness of breath., Disp: 18 g, Rfl: 1   amLODipine (NORVASC) 10 MG tablet, TAKE 1 TABLET (10 MG TOTAL) BY MOUTH DAILY., Disp: 90 tablet, Rfl: 2   pravastatin (PRAVACHOL) 40 MG tablet, Take 1 tablet (40 mg total) by mouth daily. FUTURE REFILLS W/NEW PCP, Disp: 30 tablet, Rfl: 3   Calcium Carbonate-Vitamin D (CALCIUM + D PO), Take 1 tablet by mouth 2 (two) times daily with a meal. (Patient not taking: Reported on 07/17/2021), Disp: , Rfl:   Review of Systems:  Constitutional: Denies fever, chills, diaphoresis, appetite change and fatigue.  HEENT: Denies photophobia, eye pain, redness, hearing loss, ear pain, congestion, sore throat, rhinorrhea, sneezing, mouth sores, trouble swallowing, neck pain, neck stiffness and tinnitus.   Respiratory: Denies SOB, DOE, cough, chest tightness,  and wheezing.   Cardiovascular: Denies chest pain, palpitations and leg swelling.  Gastrointestinal: Denies nausea, vomiting, abdominal pain, diarrhea, constipation, blood in stool and abdominal distention.  Genitourinary: Denies dysuria, urgency, frequency, hematuria, flank pain and difficulty urinating.  Endocrine: Denies: hot or cold intolerance, sweats, changes in hair or nails, polyuria, polydipsia. Musculoskeletal: Denies myalgias,  back pain, joint swelling, arthralgias and gait problem.  Skin: Denies pallor, rash and wound.  Neurological: Denies dizziness, seizures, syncope, weakness, light-headedness, numbness and headaches.  Hematological: Denies adenopathy. Easy bruising, personal or family bleeding history  Psychiatric/Behavioral: Denies suicidal ideation, mood changes, confusion, nervousness, sleep disturbance and agitation    Physical Exam: Vitals:   02/12/22 0756 02/12/22 0759  BP: (!) 160/78 (!)  150/70  Pulse: 80   Temp: 97.9 F (36.6 C)   TempSrc: Oral   SpO2: 99%   Weight: 123 lb 9.6 oz (56.1 kg)   Height: '5\' 3"'$  (1.6 m)    Body mass index is 21.89 kg/m.   Constitutional: NAD, calm, comfortable Eyes: PERRL, lids and conjunctivae normal, wears corrective lenses ENMT: Mucous membranes are moist.  Respiratory: clear to auscultation bilaterally, no wheezing, no crackles. Normal respiratory effort. No accessory muscle use.  Cardiovascular: Regular rate and rhythm, no murmurs / rubs / gallops. No extremity edema.  Neurologic: Grossly intact and nonfocal Psychiatric: Normal judgment and insight. Alert and oriented x 3. Normal mood.    Impression and Plan:  Encounter for osteoporosis screening in asymptomatic postmenopausal patient  - Plan: DG Bone Density  Screening for malignant neoplasm of colon  Polyp of colon, unspecified part of colon, unspecified type  - Plan: Ambulatory referral to Gastroenterology  Stage 3a chronic kidney disease ([place]) -Creatinine at last check in December 2022 was 1.120 for a GFR of 50.  Hyperlipidemia, unspecified hyperlipidemia type -Last lipid panel in December 2022 with a total cholesterol of 189, triglycerides 102 and LDL 110.  Primary hypertension -Blood pressure is elevated on 2 separate measurements. -She will do ambulatory blood pressure monitoring and return in 6 weeks for follow-up. -She is currently on amlodipine 10 mg daily.   Time spent: 46 minutes reviewing chart, interviewing and examining patient and formulating plan of care.     Lelon Frohlich, MD [facility]

## 2022-04-13 ENCOUNTER — Ambulatory Visit (INDEPENDENT_AMBULATORY_CARE_PROVIDER_SITE_OTHER): Payer: PPO | Admitting: Internal Medicine

## 2022-04-13 ENCOUNTER — Encounter: Payer: Self-pay | Admitting: Internal Medicine

## 2022-04-13 VITALS — BP 145/76 | HR 99 | Temp 98.8°F | Wt 123.6 lb

## 2022-04-13 DIAGNOSIS — I10 Essential (primary) hypertension: Secondary | ICD-10-CM | POA: Diagnosis not present

## 2022-04-13 MED ORDER — VALSARTAN 80 MG PO TABS: 80.0000 mg | ORAL_TABLET | Freq: Every day | ORAL | 1 refills | Status: DC

## 2022-04-13 NOTE — Progress Notes (Signed)
Established Patient Office Visit     CC/Reason for Visit: Blood pressure follow-up  HPI: Kristina Lucas is a 81 y.o. female who is coming in today for the above mentioned reasons. Past Medical History is significant for: Hypertension, hyperlipidemia, COPD.  At last visit she was noted to be hypertensive despite being adherent to her prescribed amlodipine 10 mg daily.  She was asked to do ambulatory blood pressure monitoring and return today.  Her home measurements notes diastolics in the mid 36O to mid 90s on average and systolics in the 294-765.  She is otherwise doing well and has no acute concerns or complaints.   Past Medical/Surgical History: Past Medical History:  Diagnosis Date   COPD (chronic obstructive pulmonary disease) ([place])    DDD (degenerative disc disease), lumbosacral 07/12/2013   Hypertension    Low back pain on right side with sciatica 03/27/2013    Past Surgical History:  Procedure Laterality Date   COLONOSCOPY     FRACTURE SURGERY Right 01/2010   hip   STERIOD INJECTION  05/30/13   lumbar    Social History:  reports that she quit smoking about 12 years ago. Her smoking use included cigarettes. She smoked an average of 1.5 packs per day. She has never used smokeless tobacco. She reports that she does not drink alcohol and does not use drugs.  Allergies: No Known Allergies  Family History:  Family History  Problem Relation Age of Onset   Cancer Mother 60       colon   Colon cancer Mother    Heart disease Father    Hypertension Father    Heart disease Brother    Hypertension Son    Esophageal cancer Neg Hx    Stomach cancer Neg Hx      Current Outpatient Medications:    albuterol (VENTOLIN HFA) 108 (90 Base) MCG/ACT inhaler, Inhale 1-2 puffs into the lungs every 4 (four) hours as needed for wheezing or shortness of breath., Disp: 18 g, Rfl: 1   amLODipine (NORVASC) 10 MG tablet, TAKE 1 TABLET (10 MG TOTAL) BY MOUTH DAILY., Disp: 90  tablet, Rfl: 2   Calcium Carbonate-Vitamin D (CALCIUM + D PO), Take 1 tablet by mouth 2 (two) times daily with a meal., Disp: , Rfl:    pravastatin (PRAVACHOL) 40 MG tablet, Take 1 tablet (40 mg total) by mouth daily. FUTURE REFILLS W/NEW PCP, Disp: 30 tablet, Rfl: 3   valsartan (DIOVAN) 80 MG tablet, Take 1 tablet (80 mg total) by mouth daily., Disp: 90 tablet, Rfl: 1  Review of Systems:  Constitutional: Denies fever, chills, diaphoresis, appetite change and fatigue.  HEENT: Denies photophobia, eye pain, redness, hearing loss, ear pain, congestion, sore throat, rhinorrhea, sneezing, mouth sores, trouble swallowing, neck pain, neck stiffness and tinnitus.   Respiratory: Denies SOB, DOE, cough, chest tightness,  and wheezing.   Cardiovascular: Denies chest pain, palpitations and leg swelling.  Gastrointestinal: Denies nausea, vomiting, abdominal pain, diarrhea, constipation, blood in stool and abdominal distention.  Genitourinary: Denies dysuria, urgency, frequency, hematuria, flank pain and difficulty urinating.  Endocrine: Denies: hot or cold intolerance, sweats, changes in hair or nails, polyuria, polydipsia. Musculoskeletal: Denies myalgias, back pain, joint swelling, arthralgias and gait problem.  Skin: Denies pallor, rash and wound.  Neurological: Denies dizziness, seizures, syncope, weakness, light-headedness, numbness and headaches.  Hematological: Denies adenopathy. Easy bruising, personal or family bleeding history  Psychiatric/Behavioral: Denies suicidal ideation, mood changes, confusion, nervousness, sleep disturbance and agitation  Physical Exam: Vitals:   04/13/22 0755 04/13/22 0758  BP: (!) 150/80 (!) 145/76  Pulse: 99   Temp: 98.8 F (37.1 C)   TempSrc: Oral   SpO2: 100%   Weight: 123 lb 9.6 oz (56.1 kg)     Body mass index is 21.89 kg/m.   Constitutional: NAD, calm, comfortable Eyes: PERRL, lids and conjunctivae normal, wears corrective lenses ENMT: Mucous  membranes are moist.  Respiratory: clear to auscultation bilaterally, no wheezing, no crackles. Normal respiratory effort. No accessory muscle use.  Cardiovascular: Regular rate and rhythm, no murmurs / rubs / gallops. No extremity edema. Psychiatric: Normal judgment and insight. Alert and oriented x 3. Normal mood.    Impression and Plan:  Primary hypertension - Plan: valsartan (DIOVAN) 80 MG tablet -Not well controlled on amlodipine 10 mg. -Add valsartan 80 mg, continue ambulatory blood pressure monitoring and return in 6 to 8 weeks for follow-up.   Time spent:32 minutes reviewing chart, interviewing and examining patient and formulating plan of care.    Lelon Frohlich, MD [facility]

## 2022-04-14 ENCOUNTER — Telehealth: Payer: Self-pay | Admitting: Internal Medicine

## 2022-04-14 NOTE — Telephone Encounter (Signed)
Office note 8/28 -Not well controlled on amlodipine 10 mg. -Add valsartan 80 mg, continue ambulatory blood pressure monitoring and return in 6 to 8 weeks for follow-up.   Patient is aware to take valsartan 80 mg.  Should she continue amlodipine 10 mg?

## 2022-04-14 NOTE — Telephone Encounter (Signed)
Pt called to say she picked up her Rx for the valsartan (DIOVAN) 80 MG tablet   Pt states she thought she was only supposed to take 40 mg.  Pt would like a call back for clarification.

## 2022-04-14 NOTE — Telephone Encounter (Signed)
Patient is aware 

## 2022-04-23 ENCOUNTER — Telehealth: Payer: Self-pay | Admitting: Internal Medicine

## 2022-04-23 NOTE — Telephone Encounter (Signed)
Hi Dr. Hilarie Fredrickson,  We received a referral for patient to have another colonoscopy. Her recall on file currently was for 06/2021 and the patient called to schedule. Would you prefer to see her in the office first or can she be scheduled.   Technically she's already been placed on your schedule for 05/25/22. Please let me know if she needs to be canceled out.   Thanks

## 2022-04-26 NOTE — Telephone Encounter (Signed)
Pt overdue already for recall colonoscopy Can leave/keep as scheduled without office visit

## 2022-05-01 ENCOUNTER — Ambulatory Visit (AMBULATORY_SURGERY_CENTER): Payer: Self-pay

## 2022-05-01 VITALS — Ht 63.0 in | Wt 122.0 lb

## 2022-05-01 DIAGNOSIS — Z800 Family history of malignant neoplasm of digestive organs: Secondary | ICD-10-CM

## 2022-05-01 DIAGNOSIS — Z86010 Personal history of colonic polyps: Secondary | ICD-10-CM

## 2022-05-01 MED ORDER — PEG 3350-KCL-NA BICARB-NACL 420 G PO SOLR: 4000.0000 mL | Freq: Once | ORAL | 0 refills | Status: AC

## 2022-05-01 NOTE — Progress Notes (Signed)
No egg or soy allergy known to patient  No issues known to pt with past sedation with any surgeries or procedures Patient denies ever being told they had issues or difficulty with intubation  No FH of Malignant Hyperthermia Pt is not on diet pills Pt is not on  home 02  Pt is not on blood thinners  Pt denies issues with constipation  No A fib or A flutter Have any cardiac testing pending--denied Pt instructed to use Single[employer].com or GoodRx for a price reduction on prep   

## 2022-05-08 ENCOUNTER — Encounter: Payer: Self-pay | Admitting: Internal Medicine

## 2022-05-25 ENCOUNTER — Ambulatory Visit (AMBULATORY_SURGERY_CENTER): Payer: PPO | Admitting: Internal Medicine

## 2022-05-25 ENCOUNTER — Encounter: Payer: Self-pay | Admitting: Internal Medicine

## 2022-05-25 VITALS — BP 131/68 | HR 92 | Temp 98.4°F | Resp 23 | Ht 63.0 in | Wt 122.0 lb

## 2022-05-25 DIAGNOSIS — Z800 Family history of malignant neoplasm of digestive organs: Secondary | ICD-10-CM | POA: Diagnosis not present

## 2022-05-25 DIAGNOSIS — D124 Benign neoplasm of descending colon: Secondary | ICD-10-CM

## 2022-05-25 DIAGNOSIS — Z86010 Personal history of colonic polyps: Secondary | ICD-10-CM

## 2022-05-25 DIAGNOSIS — Z09 Encounter for follow-up examination after completed treatment for conditions other than malignant neoplasm: Secondary | ICD-10-CM

## 2022-05-25 DIAGNOSIS — D123 Benign neoplasm of transverse colon: Secondary | ICD-10-CM | POA: Diagnosis not present

## 2022-05-25 MED ORDER — SODIUM CHLORIDE 0.9 % IV SOLN: 500.0000 mL | Freq: Once | INTRAVENOUS | Status: DC

## 2022-05-25 NOTE — Patient Instructions (Addendum)
-   Patient has a contact number available for emergencies. The signs and symptoms of potential delayed complications were discussed with the patient. Return to normal activities tomorrow. Written discharge instructions were provided to the patient. - Resume previous diet. - Continue present medications. - Await pathology results. - No repeat colonoscopy due to age at next surveillance interval.  Handouts on polyps and diverticulosis given.   YOU HAD AN ENDOSCOPIC PROCEDURE TODAY AT [facility]:   Refer to the procedure report that was given to you for any specific questions about what was found during the examination.  If the procedure report does not answer your questions, please call your gastroenterologist to clarify.  If you requested that your care partner not be given the details of your procedure findings, then the procedure report has been included in a sealed envelope for you to review at your convenience later.  YOU SHOULD EXPECT: Some feelings of bloating in the abdomen. Passage of more gas than usual.  Walking can help get rid of the air that was put into your GI tract during the procedure and reduce the bloating. If you had a lower endoscopy (such as a colonoscopy or flexible sigmoidoscopy) you may notice spotting of blood in your stool or on the toilet paper. If you underwent a bowel prep for your procedure, you may not have a normal bowel movement for a few days.  Please Note:  You might notice some irritation and congestion in your nose or some drainage.  This is from the oxygen used during your procedure.  There is no need for concern and it should clear up in a day or so.  SYMPTOMS TO REPORT IMMEDIATELY:  Following lower endoscopy (colonoscopy or flexible sigmoidoscopy):  Excessive amounts of blood in the stool  Significant tenderness or worsening of abdominal pains  Swelling of the abdomen that is new, acute  Fever of 100F or higher  For urgent or emergent  issues, a gastroenterologist can be reached at any hour by calling [phone]. Do not use MyChart messaging for urgent concerns.    DIET:  We do recommend a small meal at first, but then you may proceed to your regular diet.  Drink plenty of fluids but you should avoid alcoholic beverages for 24 hours.  ACTIVITY:  You should plan to take it easy for the rest of today and you should NOT DRIVE or use heavy machinery until tomorrow (because of the sedation medicines used during the test).    FOLLOW UP: Our staff will call the number listed on your records the next business day following your procedure.  We will call around 7:15- 8:00 am to check on you and address any questions or concerns that you may have regarding the information given to you following your procedure. If we do not reach you, we will leave a message.     If any biopsies were taken you will be contacted by phone or by letter within the next 1-3 weeks.  Please call us at [phone] if you have not heard about the biopsies in 3 weeks.    SIGNATURES/CONFIDENTIALITY: You and/or your care partner have signed paperwork which will be entered into your electronic medical record.  These signatures attest to the fact that that the information above on your After Visit Summary has been reviewed and is understood.  Full responsibility of the confidentiality of this discharge information lies with you and/or your care-partner.

## 2022-05-25 NOTE — Progress Notes (Signed)
GASTROENTEROLOGY PROCEDURE H&P NOTE   Primary Care Physician: Isaac Bliss, Rayford Halsted, MD    Reason for Procedure:  History of colon polyps and family history of colon cancer in patient's mother  Plan:    Colonoscopy  Patient is appropriate for endoscopic procedure(s) in the ambulatory ([place]) setting.  The nature of the procedure, as well as the risks, benefits, and alternatives were carefully and thoroughly reviewed with the patient. Ample time for discussion and questions allowed. The patient understood, was satisfied, and agreed to proceed.     HPI: Kristina Lucas is a 81 y.o. female who presents for surveillance colonoscopy.  Medical history as below.  Tolerated the prep.  No recent chest pain or shortness of breath.  No abdominal pain today.  Past Medical History:  Diagnosis Date   COPD (chronic obstructive pulmonary disease) ([facility])    DDD (degenerative disc disease), lumbosacral 07/12/2013   Hypertension    Low back pain on right side with sciatica 03/27/2013    Past Surgical History:  Procedure Laterality Date   COLONOSCOPY     FRACTURE SURGERY Right 01/2010   hip   STERIOD INJECTION  05/30/13   lumbar    Prior to Admission medications   Medication Sig Start Date End Date Taking? Authorizing Provider  amLODipine (NORVASC) 10 MG tablet TAKE 1 TABLET (10 MG TOTAL) BY MOUTH DAILY. 06/09/21  Yes Eulogio Bear, NP  Calcium Carbonate-Vitamin D (CALCIUM + D PO) Take 1 tablet by mouth 2 (two) times daily with a meal.   Yes [provider]  pravastatin (PRAVACHOL) 40 MG tablet Take 1 tablet (40 mg total) by mouth daily. FUTURE REFILLS W/NEW PCP 11/17/21  Yes Susy Frizzle, MD  valsartan (DIOVAN) 80 MG tablet Take 1 tablet (80 mg total) by mouth daily. 04/13/22  Yes Isaac Bliss, Rayford Halsted, MD  albuterol (VENTOLIN HFA) 108 (90 Base) MCG/ACT inhaler Inhale 1-2 puffs into the lungs every 4 (four) hours as needed for wheezing or shortness of breath.  07/17/21   Eulogio Bear, NP    Current Outpatient Medications  Medication Sig Dispense Refill   amLODipine (NORVASC) 10 MG tablet TAKE 1 TABLET (10 MG TOTAL) BY MOUTH DAILY. 90 tablet 2   Calcium Carbonate-Vitamin D (CALCIUM + D PO) Take 1 tablet by mouth 2 (two) times daily with a meal.     pravastatin (PRAVACHOL) 40 MG tablet Take 1 tablet (40 mg total) by mouth daily. FUTURE REFILLS W/NEW PCP 30 tablet 3   valsartan (DIOVAN) 80 MG tablet Take 1 tablet (80 mg total) by mouth daily. 90 tablet 1   albuterol (VENTOLIN HFA) 108 (90 Base) MCG/ACT inhaler Inhale 1-2 puffs into the lungs every 4 (four) hours as needed for wheezing or shortness of breath. 18 g 1   Current Facility-Administered Medications  Medication Dose Route Frequency Provider Last Rate Last Admin   0.9 %  sodium chloride infusion  500 mL Intravenous Once Ezariah Nace, Lajuan Lines, MD        Allergies as of 05/25/2022   (No Known Allergies)    Family History  Problem Relation Age of Onset   Cancer Mother 59       colon   Colon cancer Mother    Heart disease Father    Hypertension Father    Heart disease Brother    Hypertension Son    Esophageal cancer Neg Hx    Stomach cancer Neg Hx    Rectal cancer Neg Hx  Social History   Socioeconomic History   Marital status: Married    Spouse name: Kristina Lucas   Number of children: 2   Years of education: 12   Highest education level: 12th grade  Occupational History   Occupation: retired   Occupation: Armed forces logistics/support/administrative officer  Tobacco Use   Smoking status: Former    Packs/day: 1.50    Types: Cigarettes    Quit date: 04/17/2009    Years since quitting: 13.1   Smokeless tobacco: Never  Vaping Use   Vaping Use: Never used  Substance and Sexual Activity   Alcohol use: No   Drug use: No   Sexual activity: Yes    Birth control/protection: None  Other Topics Concern   Not on file  Social History Narrative   Married.    2 Children. Both Boys.   First Baby Died at 30 months old of  an infection.   After had Third Baby stayed home for 15 years until her boys were grown..    Social Determinants of Health   Financial Resource Strain: Not on file  Food Insecurity: Not on file  Transportation Needs: Not on file  Physical Activity: Not on file  Stress: Not on file  Social Connections: Not on file  Intimate Partner Violence: Not on file    Physical Exam: Vital signs in last 24 hours: '@BP'$  (!) 152/74   Pulse 98   Temp 98.4 F (36.9 C)   Ht '5\' 3"'$  (1.6 m)   Wt 122 lb (55.3 kg)   SpO2 97%   BMI 21.61 kg/m  GEN: NAD EYE: Sclerae anicteric ENT: MMM CV: Non-tachycardic Pulm: CTA b/l GI: Soft, NT/ND NEURO:  Alert & Oriented x 3   Kristina Jarred, MD [facility]  05/25/2022 9:06 AM

## 2022-05-25 NOTE — Progress Notes (Signed)
A and O x3. Report to RN. Tolerated MAC anesthesia well. 

## 2022-05-25 NOTE — Progress Notes (Signed)
Called to room to assist during endoscopic procedure.  Patient ID and intended procedure confirmed with present staff. Received instructions for my participation in the procedure from the performing physician.  

## 2022-05-25 NOTE — Progress Notes (Signed)
Pt's states no medical or surgical changes since previsit or office visit. 

## 2022-05-25 NOTE — Op Note (Signed)
[facility] Patient Name: Kristina Lucas Procedure Date: 05/25/2022 9:02 AM MRN: [id number] Endoscopist: Jerene Bears , MD Age: 81 Referring MD:  Date of Birth: 05-Aug-1941 Gender: Female Account #: [id number] Procedure:                Colonoscopy Indications:              High risk colon cancer surveillance: Personal                            history of multiple adenomas and SSP, family hx of                            colon cancer in patient's mother (age 53); Last                            colonoscopy: November 2019 Medicines:                Monitored Anesthesia Care Procedure:                Pre-Anesthesia Assessment:                           - Prior to the procedure, a History and Physical                            was performed, and patient medications and                            allergies were reviewed. The patient's tolerance of                            previous anesthesia was also reviewed. The risks                            and benefits of the procedure and the sedation                            options and risks were discussed with the patient.                            All questions were answered, and informed consent                            was obtained. Prior Anticoagulants: The patient has                            taken no previous anticoagulant or antiplatelet                            agents. ASA Grade Assessment: II - A patient with                            mild systemic disease. After reviewing the risks  and benefits, the patient was deemed in                            satisfactory condition to undergo the procedure.                           After obtaining informed consent, the colonoscope                            was passed under direct vision. Throughout the                            procedure, the patient's blood pressure, pulse, and                            oxygen saturations were monitored  continuously. The                            Olympus PCF-[id number]) Colonoscope was                            introduced through the anus and advanced to the                            cecum, identified by appendiceal orifice and                            ileocecal valve. The colonoscopy was performed                            without difficulty. The patient tolerated the                            procedure well. The quality of the bowel                            preparation was good. The ileocecal valve,                            appendiceal orifice, and rectum were photographed. Scope In: 9:19:12 AM Scope Out: 9:40:11 AM Scope Withdrawal Time: 0 hours 12 minutes 9 seconds  Total Procedure Duration: 0 hours 20 minutes 59 seconds  Findings:                 The digital rectal exam was normal.                           A 2 mm polyp was found in the transverse colon. The                            polyp was sessile. The polyp was removed with a                            cold snare. Resection and retrieval were complete.  A 5 mm polyp was found in the descending colon. The                            polyp was sessile. The polyp was removed with a                            cold snare. Resection and retrieval were complete.                           Multiple small-mouthed diverticula were found in                            the sigmoid colon.                           The retroflexed view of the distal rectum and anal                            verge was normal and showed no anal or rectal                            abnormalities. Complications:            No immediate complications. Estimated Blood Loss:     Estimated blood loss was minimal. Impression:               - One 2 mm polyp in the transverse colon, removed                            with a cold snare. Resected and retrieved.                           - One 5 mm polyp in the descending colon,  removed                            with a cold snare. Resected and retrieved.                           - Severe diverticulosis in the sigmoid colon.                           - The distal rectum and anal verge are normal on                            retroflexion view. Recommendation:           - Patient has a contact number available for                            emergencies. The signs and symptoms of potential                            delayed complications were discussed with the  patient. Return to normal activities tomorrow.                            Written discharge instructions were provided to the                            patient.                           - Resume previous diet.                           - Continue present medications.                           - Await pathology results.                           - No repeat colonoscopy due to age at next                            surveillance interval. Jerene Bears, MD 05/25/2022 9:44:12 AM This report has been signed electronically.

## 2022-05-26 ENCOUNTER — Telehealth: Payer: Self-pay

## 2022-05-26 NOTE — Telephone Encounter (Signed)
  Follow up Call-     05/25/2022    8:43 AM  Call back number  Post procedure Call Back phone  # [phone]  Permission to leave phone message Yes     Patient questions:  Do you have a fever, pain , or abdominal swelling? No. Pain Score  0 *  Have you tolerated food without any problems? Yes.    Have you been able to return to your normal activities? Yes.    Do you have any questions about your discharge instructions: Diet   No. Medications  No. Follow up visit  No.  Do you have questions or concerns about your Care? No.  Actions: * If pain score is 4 or above: No action needed, pain <4.

## 2022-06-01 ENCOUNTER — Encounter: Payer: Self-pay | Admitting: Internal Medicine

## 2022-06-08 ENCOUNTER — Ambulatory Visit: Payer: PPO | Admitting: Internal Medicine

## 2022-06-15 ENCOUNTER — Ambulatory Visit (INDEPENDENT_AMBULATORY_CARE_PROVIDER_SITE_OTHER): Payer: PPO | Admitting: Internal Medicine

## 2022-06-15 ENCOUNTER — Encounter: Payer: Self-pay | Admitting: Internal Medicine

## 2022-06-15 VITALS — BP 120/70 | HR 88 | Temp 98.8°F | Wt 124.1 lb

## 2022-06-15 DIAGNOSIS — I10 Essential (primary) hypertension: Secondary | ICD-10-CM

## 2022-06-15 NOTE — Progress Notes (Signed)
Established Patient Office Visit     CC/Reason for Visit: Blood pressure follow-up  HPI: Kristina Lucas is a 81 y.o. female who is coming in today for the above mentioned reasons.  At last visit her blood pressure was elevated she was started on valsartan 80 mg in addition to amlodipine 10 mg.  She is here today for follow-up.  She states that at home her average blood pressures are around 120/70.  She is feeling well and has no acute concerns or complaints.   Past Medical/Surgical History: Past Medical History:  Diagnosis Date   COPD (chronic obstructive pulmonary disease) ([place])    DDD (degenerative disc disease), lumbosacral 07/12/2013   Hypertension    Low back pain on right side with sciatica 03/27/2013    Past Surgical History:  Procedure Laterality Date   COLONOSCOPY     FRACTURE SURGERY Right 01/2010   hip   STERIOD INJECTION  05/30/13   lumbar    Social History:  reports that she quit smoking about 13 years ago. Her smoking use included cigarettes. She smoked an average of 1.5 packs per day. She has never used smokeless tobacco. She reports that she does not drink alcohol and does not use drugs.  Allergies: No Known Allergies  Family History:  Family History  Problem Relation Age of Onset   Cancer Mother 4       colon   Colon cancer Mother    Heart disease Father    Hypertension Father    Heart disease Brother    Hypertension Son    Esophageal cancer Neg Hx    Stomach cancer Neg Hx    Rectal cancer Neg Hx      Current Outpatient Medications:    albuterol (VENTOLIN HFA) 108 (90 Base) MCG/ACT inhaler, Inhale 1-2 puffs into the lungs every 4 (four) hours as needed for wheezing or shortness of breath., Disp: 18 g, Rfl: 1   amLODipine (NORVASC) 10 MG tablet, TAKE 1 TABLET (10 MG TOTAL) BY MOUTH DAILY., Disp: 90 tablet, Rfl: 2   Calcium Carbonate-Vitamin D (CALCIUM + D PO), Take 1 tablet by mouth 2 (two) times daily with a meal., Disp: , Rfl:     pravastatin (PRAVACHOL) 40 MG tablet, Take 1 tablet (40 mg total) by mouth daily. FUTURE REFILLS W/NEW PCP, Disp: 30 tablet, Rfl: 3   valsartan (DIOVAN) 80 MG tablet, Take 1 tablet (80 mg total) by mouth daily., Disp: 90 tablet, Rfl: 1  Review of Systems:  Constitutional: Denies fever, chills, diaphoresis, appetite change and fatigue.  HEENT: Denies photophobia, eye pain, redness, hearing loss, ear pain, congestion, sore throat, rhinorrhea, sneezing, mouth sores, trouble swallowing, neck pain, neck stiffness and tinnitus.   Respiratory: Denies SOB, DOE, cough, chest tightness,  and wheezing.   Cardiovascular: Denies chest pain, palpitations and leg swelling.  Gastrointestinal: Denies nausea, vomiting, abdominal pain, diarrhea, constipation, blood in stool and abdominal distention.  Genitourinary: Denies dysuria, urgency, frequency, hematuria, flank pain and difficulty urinating.  Endocrine: Denies: hot or cold intolerance, sweats, changes in hair or nails, polyuria, polydipsia. Musculoskeletal: Denies myalgias, back pain, joint swelling, arthralgias and gait problem.  Skin: Denies pallor, rash and wound.  Neurological: Denies dizziness, seizures, syncope, weakness, light-headedness, numbness and headaches.  Hematological: Denies adenopathy. Easy bruising, personal or family bleeding history  Psychiatric/Behavioral: Denies suicidal ideation, mood changes, confusion, nervousness, sleep disturbance and agitation    Physical Exam: Vitals:   06/15/22 1519 06/15/22 1521 06/15/22 1548  BP: Marland Kitchen)  140/60 131/69 120/70  Pulse: 88    Temp: 98.8 F (37.1 C)    TempSrc: Oral    SpO2: 97%    Weight: 124 lb 1.6 oz (56.3 kg)      Body mass index is 21.98 kg/m.   Constitutional: NAD, calm, comfortable Eyes: PERRL, lids and conjunctivae normal ENMT: Mucous membranes are moist.  Psychiatric: Normal judgment and insight. Alert and oriented x 3. Normal mood.    Impression and Plan:  Primary  hypertension - Plan: Basic metabolic panel, Basic metabolic panel  -Blood pressure is well controlled on amlodipine and valsartan.  Check basic metabolic panel today to check renal function and electrolytes on newly started ARB.  Time spent:22 minutes reviewing chart, interviewing and examining patient and formulating plan of care.      Lelon Frohlich, MD [facility]

## 2022-06-16 LAB — BASIC METABOLIC PANEL
BUN: 14 mg/dL (ref 6–23)
CO2: 26 mEq/L (ref 19–32)
Calcium: 9.5 mg/dL (ref 8.4–10.5)
Chloride: 102 mEq/L (ref 96–112)
Creatinine, Ser: 1.13 mg/dL (ref 0.40–1.20)
GFR: 46.43 mL/min — ABNORMAL LOW (ref >60.00)
Glucose, Bld: 81 mg/dL (ref 70–99)
Potassium: 4.4 mEq/L (ref 3.5–5.1)
Sodium: 138 mEq/L (ref 135–145)

## 2022-06-26 ENCOUNTER — Other Ambulatory Visit: Payer: Self-pay | Admitting: Nurse Practitioner

## 2022-08-11 ENCOUNTER — Other Ambulatory Visit: Payer: PPO

## 2022-10-01 ENCOUNTER — Telehealth: Payer: Self-pay | Admitting: Internal Medicine

## 2022-10-01 MED ORDER — PRAVASTATIN SODIUM 40 MG PO TABS: 40.0000 mg | ORAL_TABLET | Freq: Every day | ORAL | 0 refills | Status: DC

## 2022-10-01 NOTE — Telephone Encounter (Signed)
Refill sent.

## 2022-10-01 NOTE — Addendum Note (Signed)
Addended by: Westley Hummer B on: 10/01/2022 11:29 AM   Modules accepted: Orders

## 2022-10-01 NOTE — Telephone Encounter (Signed)
Prescription Request  10/01/2022  Is this a "Controlled Substance" medicine? No  LOV: 06/15/2022  What is the name of the medication or equipment? pravastatin (PRAVACHOL) 40 MG tablet   Have you contacted your pharmacy to request a refill? No   Which pharmacy would you like this sent to?  [id number] [address] Phone: [phone] Fax: [phone]    Patient notified that their request is being sent to the clinical staff for review and that they should receive a response within 2 business days.   Please advise at Mobile [phone] (mobile)

## 2022-10-07 ENCOUNTER — Other Ambulatory Visit: Payer: Self-pay | Admitting: Internal Medicine

## 2022-10-07 DIAGNOSIS — I10 Essential (primary) hypertension: Secondary | ICD-10-CM

## 2022-11-18 ENCOUNTER — Ambulatory Visit
Admission: RE | Admit: 2022-11-18 | Discharge: 2022-11-18 | Disposition: A | Discharge status: Home / Self Care | Payer: PPO | Source: Ambulatory Visit | Attending: Internal Medicine | Admitting: Internal Medicine

## 2022-11-18 DIAGNOSIS — Z13820 Encounter for screening for osteoporosis: Secondary | ICD-10-CM

## 2022-12-28 ENCOUNTER — Other Ambulatory Visit: Payer: Self-pay | Admitting: Internal Medicine

## 2023-04-07 ENCOUNTER — Other Ambulatory Visit: Payer: Self-pay | Admitting: Internal Medicine

## 2023-04-07 DIAGNOSIS — I10 Essential (primary) hypertension: Secondary | ICD-10-CM

## 2023-05-21 ENCOUNTER — Ambulatory Visit (INDEPENDENT_AMBULATORY_CARE_PROVIDER_SITE_OTHER): Payer: PPO

## 2023-05-21 ENCOUNTER — Telehealth: Payer: Self-pay | Admitting: Internal Medicine

## 2023-05-21 VITALS — BP 120/60 | HR 84 | Temp 98.5°F | Ht 62.0 in | Wt 124.2 lb

## 2023-05-21 DIAGNOSIS — Z0000 Encounter for general adult medical examination without abnormal findings: Secondary | ICD-10-CM | POA: Diagnosis not present

## 2023-05-21 NOTE — Patient Instructions (Addendum)
Ms. Flegal , Thank you for taking time to come for your Medicare Wellness Visit. I appreciate your ongoing commitment to your health goals. Please review the following plan we discussed and let me know if I can assist you in the future.   Referrals/Orders/Follow-Ups/Clinician Recommendations:   This is a list of the screening recommended for you and due dates:  Health Maintenance  Topic Date Due   Zoster (Shingles) Vaccine (1 of 2) Never done   Flu Shot  03/18/2023   COVID-19 Vaccine (5 - 2023-24 season) 04/18/2023   DTaP/Tdap/Td vaccine (2 - Td or Tdap) 10/17/2023   Medicare Annual Wellness Visit  05/20/2024   Pneumonia Vaccine  Completed   DEXA scan (bone density measurement)  Completed   Hepatitis C Screening  Completed   HPV Vaccine  Aged Out   Colon Cancer Screening  Discontinued    Advanced directives: (Declined) Advance directive discussed with you today. Even though you declined this today, please call our office should you change your mind, and we can give you the proper paperwork for you to fill out.  Next Medicare Annual Wellness Visit scheduled for next year: Yes

## 2023-05-21 NOTE — Progress Notes (Signed)
Subjective:   Kristina Lucas is a 82 y.o. female who presents for Medicare Annual (Subsequent) preventive examination.  Visit Complete: In person    Cardiac Risk Factors include: advanced age (>72men, >89 women);hypertension     Objective:    Today's Vitals   05/21/23 1448  BP: 120/60  Pulse: 84  Temp: 98.5 F (36.9 C)  TempSrc: Oral  SpO2: 97%  Weight: 124 lb 3.2 oz (56.3 kg)  Height: 5\' 2"  (1.575 m)   Body mass index is 22.72 kg/m.     05/21/2023    3:02 PM 07/17/2021    8:29 AM 05/28/2020    8:34 AM 05/17/2019    9:26 AM 04/28/2018   10:33 AM  Advanced Directives  Does Patient Have a Medical Advance Directive? No No No No No  Would patient like information on creating a medical advance directive? No - Patient declined No - Patient declined Yes (MAU/Ambulatory/Procedural Areas - Information given) Yes (MAU/Ambulatory/Procedural Areas - Information given) No - Patient declined    Current Medications (verified) Outpatient Encounter Medications as of 05/21/2023  Medication Sig   albuterol (VENTOLIN HFA) 108 (90 Base) MCG/ACT inhaler Inhale 1-2 puffs into the lungs every 4 (four) hours as needed for wheezing or shortness of breath.   amLODipine (NORVASC) 10 MG tablet TAKE ONE TABLET BY MOUTH DAILY   Calcium Carbonate-Vitamin D (CALCIUM + D PO) Take 1 tablet by mouth 2 (two) times daily with a meal.   pravastatin (PRAVACHOL) 40 MG tablet Take 1 tablet (40 mg total) by mouth daily. Please schedule a physical for more refills   valsartan (DIOVAN) 80 MG tablet TAKE 1 TABLET BY MOUTH DAILY   No facility-administered encounter medications on file as of 05/21/2023.    Allergies (verified) Patient has no known allergies.   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   Hypertension    Low back pain on right side with sciatica 03/27/2013   Past  Surgical History:  Procedure Laterality Date   COLONOSCOPY     FRACTURE SURGERY Right 01/2010   hip   STERIOD INJECTION  05/30/13   lumbar   Family History  Problem Relation Age of Onset   Cancer Mother 60       colon   Colon cancer Mother    Heart disease Father    Hypertension Father    Heart disease Brother    Hypertension Son    Esophageal cancer Neg Hx    Stomach cancer Neg Hx    Rectal cancer Neg Hx    Social History   Socioeconomic History   Marital status: Married    Spouse name: Dorinda Hill   Number of children: 2   Years of education: 12   Highest education level: 12th grade  Occupational History   Occupation: retired   Occupation: Education officer, environmental  Tobacco Use   Smoking status: Former    Current packs/day: 0.00    Types: Cigarettes    Quit date: 04/17/2009    Years since quitting: 14.1   Smokeless tobacco: Never  Vaping Use   Vaping status: Never Used  Substance and Sexual Activity   Alcohol use: No   Drug use: No   Sexual activity: Yes    Birth control/protection: None  Other Topics Concern   Not on file  Social History Narrative   Married.    2 Children. Both Boys.  First Baby Died at 24 months old of an infection.   After had Third Baby stayed home for 15 years until her boys were grown..    Social Determinants of Health   Financial Resource Strain: Low Risk  (05/21/2023)   Overall Financial Resource Strain (CARDIA)    Difficulty of Paying Living Expenses: Not hard at all  Food Insecurity: No Food Insecurity (05/21/2023)   Hunger Vital Sign    Worried About Running Out of Food in the Last Year: Never true    Ran Out of Food in the Last Year: Never true  Transportation Needs: No Transportation Needs (05/21/2023)   PRAPARE - Administrator, Civil Service (Medical): No    Lack of Transportation (Non-Medical): No  Physical Activity: Inactive (05/21/2023)   Exercise Vital Sign    Days of Exercise per Week: 0 days    Minutes of Exercise per  Session: 0 min  Stress: No Stress Concern Present (05/21/2023)   [employer] of Occupational Health - Occupational Stress Questionnaire    Feeling of Stress : Not at all  Social Connections: Socially Integrated (05/21/2023)   Social Connection and Isolation Panel [NHANES]    Frequency of Communication with Friends and Family: More than three times a week    Frequency of Social Gatherings with Friends and Family: More than three times a week    Attends Religious Services: More than 4 times per year    Active Member of [employer] or Organizations: Yes    Attends Engineer, structural: More than 4 times per year    Marital Status: Married    Tobacco Counseling Counseling given: Not Answered   Clinical Intake:  Pre-visit preparation completed: Yes  Pain : No/denies pain     BMI - recorded: 22.72 Nutritional Status: BMI of 19-24  Normal Nutritional Risks: None Diabetes: No  How often do you need to have someone help you when you read instructions, pamphlets, or other written materials from your doctor or pharmacy?: 1 - Never  Interpreter Needed?: No  Information entered by :: Theresa Mulligan LPN   Activities of Daily Living    05/21/2023    3:01 PM  In your present state of health, do you have any difficulty performing the following activities:  Hearing? 0  Vision? 0  Difficulty concentrating or making decisions? 0  Walking or climbing stairs? 0  Dressing or bathing? 0  Doing errands, shopping? 0  Preparing Food and eating ? N  Using the Toilet? N  In the past six months, have you accidently leaked urine? N  Do you have problems with loss of bowel control? N  Managing your Medications? N  Managing your Finances? N  Housekeeping or managing your Housekeeping? N    Patient Care Team: Philip Aspen, Limmie Patricia, MD as PCP - General (Internal Medicine)  Indicate any recent Medical Services you may have received from other than [facility] providers in the past year  (date may be approximate).     Assessment:   This is a routine wellness examination for [place].  Hearing/Vision screen Hearing Screening - Comments:: Denies hearing difficulties   Vision Screening - Comments:: Wears rx glasses - up to date with routine eye exams with  Dr Sheppard Penton   Goals Addressed               This Visit's Progress     Stay Active (pt-stated)         Depression Screen  05/21/2023    3:00 PM 04/13/2022    8:14 AM 02/12/2022    8:33 AM 07/17/2021    8:40 AM 05/28/2020    8:31 AM 11/20/2019    3:28 PM 05/17/2019    9:25 AM  PHQ 2/9 Scores  PHQ - 2 Score 0  1 0 0 0 0  PHQ- 9 Score   3 0     Exception Documentation  Patient refusal         Fall Risk    05/21/2023    3:01 PM 02/12/2022    8:33 AM 07/17/2021    8:45 AM 07/17/2021    8:30 AM 12/23/2020   10:59 AM  Fall Risk   Falls in the past year? 0 0 0 0 0  Number falls in past yr: 0 0 0 0 0  Injury with Fall? 0 0 0 0 0  Risk for fall due to : No Fall Risks No Fall Risks     Follow up Falls prevention discussed Falls evaluation completed  Falls prevention discussed     MEDICARE RISK AT HOME: Medicare Risk at Home Any stairs in or around the home?: No If so, are there any without handrails?: No Home free of loose throw rugs in walkways, pet beds, electrical cords, etc?: Yes Adequate lighting in your home to reduce risk of falls?: Yes Life alert?: No Use of a cane, walker or w/c?: No Grab bars in the bathroom?: No Shower chair or bench in shower?: No Elevated toilet seat or a handicapped toilet?: No  TIMED UP AND GO:  Was the test performed?  Yes  Length of time to ambulate 10 feet: 10 sec Gait steady and fast without use of assistive device    Cognitive Function:        05/21/2023    3:02 PM 07/17/2021    8:30 AM 04/28/2018   10:36 AM  6CIT Screen  What Year? 0 points 0 points 0 points  What month? 0 points 0 points 0 points  What time? 0 points 0 points 0 points  Count back from 20 0 points  0 points 0 points  Months in reverse 0 points 2 points 0 points  Repeat phrase 0 points 0 points 2 points  Total Score 0 points 2 points 2 points    Immunizations Immunization History  Administered Date(s) Administered   Fluad Quad(high Dose 65+) 06/06/2020   Influenza Whole 05/24/2013   Influenza, High Dose Seasonal PF 05/10/2014, 05/18/2018   Influenza, Quadrivalent, Recombinant, Inj, Pf 04/26/2019   Influenza-Unspecified 05/18/2015, 04/28/2017, 06/09/2021   PFIZER(Purple Top)SARS-COV-2 Vaccination 08/27/2019, 09/27/2019, 07/01/2020, 09/24/2021   Pneumococcal Conjugate-13 04/18/2014   Pneumococcal Polysaccharide-23 04/12/2013   Tdap 10/16/2013    TDAP status: Up to date  Flu Vaccine status: Due, Education has been provided regarding the importance of this vaccine. Advised may receive this vaccine at local pharmacy or Health Dept. Aware to provide a copy of the vaccination record if obtained from local pharmacy or Health Dept. Verbalized acceptance and understanding.  Pneumococcal vaccine status: Up to date  Covid-19 vaccine status: Declined, Education has been provided regarding the importance of this vaccine but patient still declined. Advised may receive this vaccine at local pharmacy or Health Dept.or vaccine clinic. Aware to provide a copy of the vaccination record if obtained from local pharmacy or Health Dept. Verbalized acceptance and understanding.  Qualifies for Shingles Vaccine? Yes   Zostavax completed No   Shingrix Completed?: No.    Education has  been provided regarding the importance of this vaccine. Patient has been advised to call insurance company to determine out of pocket expense if they have not yet received this vaccine. Advised may also receive vaccine at local pharmacy or Health Dept. Verbalized acceptance and understanding.  Screening Tests Health Maintenance  Topic Date Due   Zoster Vaccines- Shingrix (1 of 2) Never done   INFLUENZA VACCINE  03/18/2023    COVID-19 Vaccine (5 - 2023-24 season) 04/18/2023   DTaP/Tdap/Td (2 - Td or Tdap) 10/17/2023   Medicare Annual Wellness (AWV)  05/20/2024   Pneumonia Vaccine 23+ Years old  Completed   DEXA SCAN  Completed   Hepatitis C Screening  Completed   HPV VACCINES  Aged Out   Colonoscopy  Discontinued    Health Maintenance  Health Maintenance Due  Topic Date Due   Zoster Vaccines- Shingrix (1 of 2) Never done   INFLUENZA VACCINE  03/18/2023   COVID-19 Vaccine (5 - 2023-24 season) 04/18/2023        Bone Density status: Completed 11/18/22. Results reflect: Bone density results: OSTEOPENIA. Repeat every   years.     Additional Screening:  Hepatitis C Screening: does qualify; Completed 05/29/11  Vision Screening: Recommended annual ophthalmology exams for early detection of glaucoma and other disorders of the eye. Is the patient up to date with their annual eye exam?  Yes  Who is the provider or what is the name of the office in which the patient attends annual eye exams? Dr Sheppard Penton If pt is not established with a provider, would they like to be referred to a provider to establish care? No .   Dental Screening: Recommended annual dental exams for proper oral hygiene    Community Resource Referral / Chronic Care Management:  CRR required this visit?  No   CCM required this visit?  No     Plan:     I have personally reviewed and noted the following in the patient's chart:   Medical and social history Use of alcohol, tobacco or illicit drugs  Current medications and supplements including opioid prescriptions. Patient is not currently taking opioid prescriptions. Functional ability and status Nutritional status Physical activity Advanced directives List of other physicians Hospitalizations, surgeries, and ER visits in previous 12 months Vitals Screenings to include cognitive, depression, and falls Referrals and appointments  In addition, I have reviewed and discussed  with patient certain preventive protocols, quality metrics, and best practice recommendations. A written personalized care plan for preventive services as well as general preventive health recommendations were provided to patient.     Tillie Rung, LPN   [id number]   After Visit Summary: Given  Nurse Notes: None

## 2023-05-21 NOTE — Telephone Encounter (Signed)
Patient dropped off document Handicap Placard, to be filled out by provider. Patient requested to send it back via Call Patient to pick up within 7-days. Document is located in providers tray at front office.Please advise at [facility] [phone]

## 2023-05-24 NOTE — Telephone Encounter (Signed)
Ok to fill out? Please advise  

## 2023-05-25 ENCOUNTER — Telehealth: Payer: Self-pay

## 2023-05-25 NOTE — Telephone Encounter (Signed)
Patient's handicapped placard has been filled out and patient is aware that is is at the front desk ready to be picked up

## 2023-05-25 NOTE — Telephone Encounter (Signed)
Forms given to provider to view.

## 2023-07-04 ENCOUNTER — Other Ambulatory Visit: Payer: Self-pay | Admitting: Internal Medicine

## 2023-07-04 DIAGNOSIS — I10 Essential (primary) hypertension: Secondary | ICD-10-CM

## 2023-07-05 ENCOUNTER — Ambulatory Visit (INDEPENDENT_AMBULATORY_CARE_PROVIDER_SITE_OTHER): Payer: PPO | Admitting: Internal Medicine

## 2023-07-05 ENCOUNTER — Encounter: Payer: Self-pay | Admitting: Internal Medicine

## 2023-07-05 VITALS — BP 120/70 | HR 117 | Temp 99.4°F | Ht 62.0 in | Wt 120.4 lb

## 2023-07-05 DIAGNOSIS — E785 Hyperlipidemia, unspecified: Secondary | ICD-10-CM

## 2023-07-05 DIAGNOSIS — N182 Chronic kidney disease, stage 2 (mild): Secondary | ICD-10-CM | POA: Diagnosis not present

## 2023-07-05 DIAGNOSIS — I10 Essential (primary) hypertension: Secondary | ICD-10-CM | POA: Diagnosis not present

## 2023-07-05 DIAGNOSIS — J439 Emphysema, unspecified: Secondary | ICD-10-CM | POA: Diagnosis not present

## 2023-07-05 DIAGNOSIS — Z0000 Encounter for general adult medical examination without abnormal findings: Secondary | ICD-10-CM

## 2023-07-05 DIAGNOSIS — J441 Chronic obstructive pulmonary disease with (acute) exacerbation: Secondary | ICD-10-CM

## 2023-07-05 LAB — CBC WITH DIFFERENTIAL/PLATELET
Basophils Absolute: 0 10*3/uL (ref 0.0–0.1)
Basophils Relative: 0.2 % (ref 0.0–3.0)
Eosinophils Absolute: 0 10*3/uL (ref 0.0–0.7)
Eosinophils Relative: 0.2 % (ref 0.0–5.0)
HCT: 35.1 % — ABNORMAL LOW (ref 36.0–46.0)
Hemoglobin: 11.9 g/dL — ABNORMAL LOW (ref 12.0–15.0)
Lymphocytes Relative: 10.8 % — ABNORMAL LOW (ref 12.0–46.0)
Lymphs Abs: 1.6 10*3/uL (ref 0.7–4.0)
MCHC: 34.1 g/dL (ref 30.0–36.0)
MCV: 83.4 fL (ref 78.0–100.0)
Monocytes Absolute: 2.2 10*3/uL — ABNORMAL HIGH (ref 0.1–1.0)
Monocytes Relative: 14.5 % — ABNORMAL HIGH (ref 3.0–12.0)
Neutro Abs: 11.1 10*3/uL — ABNORMAL HIGH (ref 1.4–7.7)
Neutrophils Relative %: 74.3 % (ref 43.0–77.0)
Platelets: 417 10*3/uL — ABNORMAL HIGH (ref 150.0–400.0)
RBC: 4.21 Mil/uL (ref 3.87–5.11)
RDW: 13.3 % (ref 11.5–15.5)
WBC: 15 10*3/uL — ABNORMAL HIGH (ref 4.0–10.5)

## 2023-07-05 LAB — VITAMIN D 25 HYDROXY (VIT D DEFICIENCY, FRACTURES): VITD: 38.58 ng/mL (ref 30.00–100.00)

## 2023-07-05 MED ORDER — PREDNISONE 10 MG (21) PO TBPK: ORAL_TABLET | ORAL | 0 refills | Status: AC

## 2023-07-05 MED ORDER — PRAVASTATIN SODIUM 40 MG PO TABS: 40.0000 mg | ORAL_TABLET | Freq: Every day | ORAL | 1 refills | Status: DC

## 2023-07-05 MED ORDER — AMOXICILLIN-POT CLAVULANATE 500-125 MG PO TABS: 1.0000 | ORAL_TABLET | Freq: Two times a day (BID) | ORAL | 0 refills | Status: AC

## 2023-07-05 MED ORDER — ALBUTEROL SULFATE HFA 108 (90 BASE) MCG/ACT IN AERS: 1.0000 | INHALATION_SPRAY | RESPIRATORY_TRACT | 1 refills | Status: AC | PRN

## 2023-07-05 NOTE — Progress Notes (Signed)
Established Patient Office Visit     CC/Reason for Visit: Annual preventive exam, discuss acute concern  HPI: Kristina Lucas is a 82 y.o. female who is coming in today for the above mentioned reasons. Past Medical History is significant for: Hypertension, hyperlipidemia, COPD.  Has routine eye and dental care.  Is due for COVID and RSV vaccinations.  She no longer does cancer screening due to age.  For the last week has been having URI symptoms.  She has had a worsening cough and shortness of breath.   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 Her smoking use included cigarettes. She has never used smokeless tobacco. She reports that she does not drink alcohol and does not use drugs.  Allergies: No Known Allergies  Family History:  Family History  Problem Relation Age of Onset   Cancer Mother 29       colon   Colon cancer Mother    Heart disease Father    Hypertension Father    Heart disease Brother    Hypertension Son    Esophageal cancer Neg Hx    Stomach cancer Neg Hx    Rectal cancer Neg Hx      Current Outpatient Medications:    amLODipine (NORVASC) 10 MG tablet, TAKE ONE TABLET BY MOUTH DAILY, Disp: 90 tablet, Rfl: 2   amoxicillin-clavulanate (AUGMENTIN) 500-125 MG tablet, Take 1 tablet by mouth 2 (two) times daily for 7 days., Disp: 14 tablet, Rfl: 0   Calcium Carbonate-Vitamin D (CALCIUM + D PO), Take 1 tablet by mouth 2 (two) times daily with a meal., Disp: , Rfl:    predniSONE (STERAPRED UNI-PAK 21 TAB) 10 MG (21) TBPK  tablet, Take as directed, Disp: 21 tablet, Rfl: 0   valsartan (DIOVAN) 80 MG tablet, TAKE 1 TABLET BY MOUTH DAILY, Disp: 90 tablet, Rfl: 0   albuterol (VENTOLIN HFA) 108 (90 Base) MCG/ACT inhaler, Inhale 1-2 puffs into the lungs every 4 (four) hours as needed for wheezing or shortness of breath., Disp: 18 g, Rfl: 1   pravastatin (PRAVACHOL) 40 MG tablet, Take 1 tablet (40 mg total) by mouth daily., Disp: 90 tablet, Rfl: 1  Review of Systems:  Negative unless indicated in HPI.   Physical Exam: Vitals:   07/05/23 1405  BP: 120/70  Pulse: (!) 117  Temp: 99.4 F (37.4 C)  TempSrc: Oral  SpO2: 97%  Weight: 120 lb 6.4 oz (54.6 kg)  Height: 5\' 2"  (1.575 m)    Body mass index is 22.02 kg/m.   Physical Exam Vitals reviewed.  Constitutional:      General: She is not in acute distress.    Appearance: Normal appearance. She is not ill-appearing, toxic-appearing or diaphoretic.  HENT:     Head: Normocephalic.     Right Ear: Tympanic membrane, ear canal and external ear normal. There is no impacted cerumen.     Left Ear: Tympanic membrane, ear canal and external ear normal. There is no impacted cerumen.  Nose: Nose normal.     Mouth/Throat:     Mouth: Mucous membranes are moist.     Pharynx: Oropharynx is clear. No oropharyngeal exudate or posterior oropharyngeal erythema.  Eyes:     General: No scleral icterus.       Right eye: No discharge.        Left eye: No discharge.     Conjunctiva/sclera: Conjunctivae normal.     Pupils: Pupils are equal, round, and reactive to light.  Neck:     Vascular: No carotid bruit.  Cardiovascular:     Rate and Rhythm: Normal rate and regular rhythm.     Pulses: Normal pulses.     Heart sounds: Normal heart sounds.  Pulmonary:     Effort: Pulmonary effort is normal. No respiratory distress.     Breath sounds: Normal breath sounds.  Abdominal:     General: Abdomen is flat. Bowel sounds are normal.     Palpations: Abdomen is soft.   Musculoskeletal:        General: Normal range of motion.     Cervical back: Normal range of motion.  Skin:    General: Skin is warm and dry.  Neurological:     General: No focal deficit present.     Mental Status: She is alert and oriented to person, place, and time. Mental status is at baseline.  Psychiatric:        Mood and Affect: Mood normal.        Behavior: Behavior normal.        Thought Content: Thought content normal.        Judgment: Judgment normal.     Impression and Plan:  Encounter for preventive health examination  Pulmonary emphysema, unspecified emphysema type (HCC) -     Albuterol Sulfate HFA; Inhale 1-2 puffs into the lungs every 4 (four) hours as needed for wheezing or shortness of breath.  Dispense: 18 g; Refill: 1  Primary hypertension -     CBC with Differential/Platelet; Future -     Comprehensive metabolic panel; Future  Hyperlipidemia, unspecified hyperlipidemia type -     Lipid panel; Future  CKD (chronic kidney disease) stage 2, GFR 60-89 ml/min -     VITAMIN D 25 Hydroxy (Vit-D Deficiency, Fractures); Future  COPD exacerbation (HCC) -     Amoxicillin-Pot Clavulanate; Take 1 tablet by mouth 2 (two) times daily for 7 days.  Dispense: 14 tablet; Refill: 0 -     predniSONE; Take as directed  Dispense: 21 tablet; Refill: 0  Other orders -     Pravastatin Sodium; Take 1 tablet (40 mg total) by mouth daily.  Dispense: 90 tablet; Refill: 1   -Recommend routine eye and dental care. -Healthy lifestyle discussed in detail. -Labs to be updated today. -Prostate cancer screening: Not applicable Health Maintenance  Topic Date Due   COVID-19 Vaccine (5 - 2023-24 season) 07/21/2023*   Zoster (Shingles) Vaccine (1 of 2) 10/05/2023*   DTaP/Tdap/Td vaccine (2 - Td or Tdap) 10/17/2023   Medicare Annual Wellness Visit  05/20/2024   Pneumonia Vaccine  Completed   Flu Shot  Completed   DEXA scan (bone density measurement)  Completed   HPV Vaccine  Aged Out    Colon Cancer Screening  Discontinued   Hepatitis C Screening  Discontinued  *Topic was postponed. The date shown is not the original due date.     -Cough and shortness of breath likely to represent a COPD exacerbation.  No significant wheezing  on exam today although she does have decreased breath sounds.  Send in prednisone taper and a week of antibiotics.  She knows to follow-up with [place] if she does not improve.     Chaya Jan, MD  Primary Care at [facility]

## 2023-07-06 LAB — COMPREHENSIVE METABOLIC PANEL
ALT: 15 U/L (ref 0–35)
AST: 16 U/L (ref 0–37)
Albumin: 4.2 g/dL (ref 3.5–5.2)
Alkaline Phosphatase: 72 U/L (ref 39–117)
BUN: 10 mg/dL (ref 6–23)
CO2: 24 meq/L (ref 19–32)
Calcium: 9.8 mg/dL (ref 8.4–10.5)
Chloride: 98 meq/L (ref 96–112)
Creatinine, Ser: 0.87 mg/dL (ref 0.40–1.20)
GFR: 63.08 mL/min (ref >60.00)
Glucose, Bld: 82 mg/dL (ref 70–99)
Potassium: 3.9 meq/L (ref 3.5–5.1)
Sodium: 136 meq/L (ref 135–145)
Total Bilirubin: 0.7 mg/dL (ref 0.2–1.2)
Total Protein: 8 g/dL (ref 6.0–8.3)

## 2023-07-06 LAB — LIPID PANEL
Cholesterol: 171 mg/dL (ref 0–200)
HDL: 39.2 mg/dL (ref >39.00)
LDL Cholesterol: 112 mg/dL — ABNORMAL HIGH (ref 0–99)
NonHDL: 131.37
Total CHOL/HDL Ratio: 4
Triglycerides: 98 mg/dL (ref 0.0–149.0)
VLDL: 19.6 mg/dL (ref 0.0–40.0)

## 2023-07-08 ENCOUNTER — Other Ambulatory Visit: Payer: Self-pay | Admitting: *Deleted

## 2023-07-08 DIAGNOSIS — D649 Anemia, unspecified: Secondary | ICD-10-CM

## 2023-07-09 ENCOUNTER — Other Ambulatory Visit (INDEPENDENT_AMBULATORY_CARE_PROVIDER_SITE_OTHER): Payer: PPO

## 2023-07-09 DIAGNOSIS — D649 Anemia, unspecified: Secondary | ICD-10-CM

## 2023-07-09 LAB — IBC PANEL
Iron: 69 ug/dL (ref 42–145)
Saturation Ratios: 26.5 % (ref 20.0–50.0)
TIBC: 260.4 ug/dL (ref 250.0–450.0)
Transferrin: 186 mg/dL — ABNORMAL LOW (ref 212.0–360.0)

## 2023-07-09 LAB — FERRITIN: Ferritin: 189 ng/mL (ref 10.0–291.0)

## 2023-12-31 ENCOUNTER — Other Ambulatory Visit: Payer: Self-pay | Admitting: Internal Medicine

## 2023-12-31 DIAGNOSIS — I10 Essential (primary) hypertension: Secondary | ICD-10-CM

## 2024-01-28 ENCOUNTER — Other Ambulatory Visit: Payer: Self-pay | Admitting: Internal Medicine

## 2024-01-28 NOTE — Telephone Encounter (Unsigned)
 Copied from CRM [id number]. Topic: Clinical - Medication Refill >> Jan 28, 2024 10:39 AM Clyde Darling P wrote: Medication: amLODipine  (NORVASC ) 10 MG tablet  Has the patient contacted their pharmacy? Yes- nomore refills (Agent: If no, request that the patient contact the pharmacy for the refill. If patient does not wish to contact the pharmacy document the reason why and proceed with request.) (Agent: If yes, when and what did the pharmacy advise?)  This is the patient's preferred pharmacy:  [facility] [id number] - [place], [place] - [address] [place] [address] Phone: [phone] Fax: [phone]  Is this the correct pharmacy for this prescription? Yes If no, delete pharmacy and type the correct one.   Has the prescription been filled recently? No  Is the patient out of the medication? Yes- 3 more days left  Has the patient been seen for an appointment in the last year OR does the patient have an upcoming appointment? Yes  Can we respond through MyChart? Yes  Agent: Please be advised that Rx refills may take up to 3 business days. We ask that you follow-up with your pharmacy.

## 2024-01-31 MED ORDER — AMLODIPINE BESYLATE 10 MG PO TABS: ORAL_TABLET | ORAL | 1 refills | Status: AC

## 2024-03-28 ENCOUNTER — Other Ambulatory Visit: Payer: Self-pay | Admitting: Internal Medicine

## 2024-03-28 DIAGNOSIS — I10 Essential (primary) hypertension: Secondary | ICD-10-CM

## 2024-04-26 ENCOUNTER — Other Ambulatory Visit: Payer: Self-pay | Admitting: Internal Medicine

## 2024-05-24 ENCOUNTER — Ambulatory Visit: Payer: PPO

## 2024-05-24 VITALS — BP 120/62 | HR 83 | Temp 99.0°F | Ht 62.0 in | Wt 124.7 lb

## 2024-05-24 DIAGNOSIS — Z0000 Encounter for general adult medical examination without abnormal findings: Secondary | ICD-10-CM | POA: Diagnosis not present

## 2024-05-24 NOTE — Patient Instructions (Addendum)
 [name],  Thank you for taking the time for your Medicare Wellness Visit. I appreciate your continued commitment to your health goals. Please review the care plan we discussed, and feel free to reach out if I can assist you further.  Medicare recommends these wellness visits once per year to help you and your care team stay ahead of potential health issues. These visits are designed to focus on prevention, allowing your provider to concentrate on managing your acute and chronic conditions during your regular appointments.  Please note that Annual Wellness Visits do not include a physical exam. Some assessments may be limited, especially if the visit was conducted virtually. If needed, we may recommend a separate in-person follow-up with your provider.  Ongoing Care Seeing your primary care provider every 3 to 6 months helps us  monitor your health and provide consistent, personalized care.   Referrals If a referral was made during today's visit and you haven't received any updates within two weeks, please contact the referred provider directly to check on the status.  Recommended Screenings:  Health Maintenance  Topic Date Due   Zoster (Shingles) Vaccine (1 of 2) Never done   DTaP/Tdap/Td vaccine (2 - Td or Tdap) 10/17/2023   Flu Shot  03/17/2024   COVID-19 Vaccine (5 - 2025-26 season) 04/17/2024   Medicare Annual Wellness Visit  05/24/2025   Pneumococcal Vaccine for age over 72  Completed   DEXA scan (bone density measurement)  Completed   Meningitis B Vaccine  Aged Out   Colon Cancer Screening  Discontinued   Hepatitis C Screening  Discontinued       05/24/2024    3:08 PM  Advanced Directives  Does Patient Have a Medical Advance Directive? No  Would patient like information on creating a medical advance directive? No - Patient declined   Advance Care Planning is important because it: Ensures you receive medical care that aligns with your values, goals, and  preferences. Provides guidance to your family and loved ones, reducing the emotional burden of decision-making during critical moments.  Vision: Annual vision screenings are recommended for early detection of glaucoma, cataracts, and diabetic retinopathy. These exams can also reveal signs of chronic conditions such as diabetes and high blood pressure.  Dental: Annual dental screenings help detect early signs of oral cancer, gum disease, and other conditions linked to overall health, including heart disease and diabetes.  Please see the attached documents for additional preventive care recommendations.

## 2024-05-24 NOTE — Progress Notes (Signed)
 Subjective:   Kristina Lucas is a 83 y.o. who presents for a Medicare Wellness preventive visit.  As a reminder, Annual Wellness Visits don't include a physical exam, and some assessments may be limited, especially if this visit is performed virtually. We may recommend an in-person follow-up visit with your provider if needed.  Visit Complete: In person    Persons Participating in Visit: Patient.  AWV Questionnaire: No: Patient Medicare AWV questionnaire was not completed prior to this visit.  Cardiac Risk Factors include: advanced age (>32men, >2 women);hypertension     Objective:    Today's Vitals   05/24/24 1445  BP: 120/62  Pulse: 83  Temp: 99 F (37.2 C)  TempSrc: Oral  SpO2: 96%  Weight: 124 lb 11.2 oz (56.6 kg)  Height: 5' 2 (1.575 m)   Body mass index is 22.81 kg/m.     05/24/2024    3:08 PM 05/21/2023    3:02 PM 07/17/2021    8:29 AM 05/28/2020    8:34 AM 05/17/2019    9:26 AM 04/28/2018   10:33 AM  Advanced Directives  Does Patient Have a Medical Advance Directive? No No No No No No   Would patient like information on creating a medical advance directive? No - Patient declined No - Patient declined No - Patient declined Yes (MAU/Ambulatory/Procedural Areas - Information given) Yes (MAU/Ambulatory/Procedural Areas - Information given) No - Patient declined      Data saved with a previous flowsheet row definition    Current Medications (verified) Outpatient Encounter Medications as of 05/24/2024  Medication Sig   albuterol  (VENTOLIN  HFA) 108 (90 Base) MCG/ACT inhaler Inhale 1-2 puffs into the lungs every 4 (four) hours as needed for wheezing or shortness of breath.   amLODipine  (NORVASC ) 10 MG tablet TAKE ONE TABLET BY MOUTH DAILY   Calcium Carbonate-Vitamin D  (CALCIUM + D PO) Take 1 tablet by mouth 2 (two) times daily with a meal.   pravastatin  (PRAVACHOL ) 40 MG tablet TAKE 1 TABLET BY MOUTH DAILY   predniSONE  (STERAPRED UNI-PAK 21 TAB) 10 MG (21)  TBPK tablet Take as directed (Patient not taking: Reported on 05/24/2024)   valsartan  (DIOVAN ) 80 MG tablet TAKE 1 TABLET BY MOUTH DAILY   No facility-administered encounter medications on file as of 05/24/2024.    Allergies (verified) Patient has no known allergies.   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   Hypertension    Low back pain on right side with sciatica 03/27/2013   Past Surgical History:  Procedure Laterality Date   COLONOSCOPY     FRACTURE SURGERY Right 01/2010   hip   STERIOD INJECTION  05/30/13   lumbar   Family History  Problem Relation Age of Onset   Cancer Mother 19       colon   Colon cancer Mother    Heart disease Father    Hypertension Father    Heart disease Brother    Hypertension Son    Esophageal cancer Neg Hx    Stomach cancer Neg Hx    Rectal cancer Neg Hx    Social History   Socioeconomic History   Marital status: Married    Spouse name: Nancyann   Number of children: 2   Years of education: 12   Highest education level: 12th grade  Occupational History   Occupation: retired   Occupation: Education officer, environmental  Tobacco Use  Smoking status: Former    Current packs/day: 0.00    Types: Cigarettes    Quit date: 04/17/2009    Years since quitting: 15.1   Smokeless tobacco: Never  Vaping Use   Vaping status: Never Used  Substance and Sexual Activity   Alcohol use: No   Drug use: No   Sexual activity: Yes    Birth control/protection: None  Other Topics Concern   Not on file  Social History Narrative   Married.    2 Children. Both Boys.   First Baby Died at 36 months old of an infection.   After had Third Baby stayed home for 15 years until her boys were grown..    Social Drivers of Health   Financial Resource Strain: Low Risk  (05/24/2024)   Overall Financial Resource Strain (CARDIA)    Difficulty of Paying Living  Expenses: Not hard at all  Food Insecurity: No Food Insecurity (05/24/2024)   Hunger Vital Sign    Worried About Running Out of Food in the Last Year: Never true    Ran Out of Food in the Last Year: Never true  Transportation Needs: No Transportation Needs (05/24/2024)   PRAPARE - Administrator, Civil Service (Medical): No    Lack of Transportation (Non-Medical): No  Physical Activity: Inactive (05/24/2024)   Exercise Vital Sign    Days of Exercise per Week: 0 days    Minutes of Exercise per Session: 0 min  Stress: No Stress Concern Present (05/24/2024)   [employer] of Occupational Health - Occupational Stress Questionnaire    Feeling of Stress: Not at all  Social Connections: Socially Integrated (05/24/2024)   Social Connection and Isolation Panel    Frequency of Communication with Friends and Family: More than three times a week    Frequency of Social Gatherings with Friends and Family: More than three times a week    Attends Religious Services: More than 4 times per year    Active Member of [employer] or Organizations: Yes    Attends Engineer, structural: More than 4 times per year    Marital Status: Married    Tobacco Counseling Counseling given: Not Answered    Clinical Intake:  Pre-visit preparation completed: Yes  Pain : No/denies pain     BMI - recorded: 22.81 Nutritional Status: BMI of 19-24  Normal Nutritional Risks: None Diabetes: No  No results found for: HGBA1C   How often do you need to have someone help you when you read instructions, pamphlets, or other written materials from your doctor or pharmacy?: 1 - Never  Interpreter Needed?: No  Information entered by :: Rojelio Blush LPN   Activities of Daily Living     05/24/2024    3:06 PM  In your present state of health, do you have any difficulty performing the following activities:  Hearing? 0  Vision? 0  Difficulty concentrating or making decisions? 0  Walking or climbing  stairs? 0  Dressing or bathing? 0  Doing errands, shopping? 0  Preparing Food and eating ? N  Using the Toilet? N  In the past six months, have you accidently leaked urine? N  Do you have problems with loss of bowel control? N  Managing your Medications? N  Managing your Finances? N  Housekeeping or managing your Housekeeping? N    Patient Care Team: Theophilus Andrews, Tully GRADE, MD as PCP - General (Internal Medicine)  I have updated your Care Teams any recent Medical Services  you may have received from other providers in the past year.     Assessment:   This is a routine wellness examination for [place].  Hearing/Vision screen Hearing Screening - Comments:: Denies hearing difficulties   Vision Screening - Comments:: Wears rx glasses - up to date with routine eye exams with  [facility]   Goals Addressed               This Visit's Progress     Increase physical activity (pt-stated)        Get more active.       Depression Screen     05/24/2024    2:44 PM 07/05/2023    3:06 PM 05/21/2023    3:00 PM 04/13/2022    8:14 AM 02/12/2022    8:33 AM 07/17/2021    8:40 AM 05/28/2020    8:31 AM  PHQ 2/9 Scores  PHQ - 2 Score 0 0 0  1 0 0  PHQ- 9 Score  0   3 0   Exception Documentation    Patient refusal       Fall Risk     05/24/2024    3:07 PM 07/05/2023    3:05 PM 05/21/2023    3:01 PM 02/12/2022    8:33 AM 07/17/2021    8:45 AM  Fall Risk   Falls in the past year? 1 0 0 0 0  Number falls in past yr: 0 0 0 0 0  Injury with Fall? 0 0 0 0 0  Risk for fall due to : No Fall Risks  No Fall Risks No Fall Risks   Follow up Falls evaluation completed Falls evaluation completed Falls prevention discussed Falls evaluation completed       Data saved with a previous flowsheet row definition    MEDICARE RISK AT HOME:  Medicare Risk at Home Any stairs in or around the home?: No If so, are there any without handrails?: No Home free of loose throw rugs in walkways, pet beds,  electrical cords, etc?: Yes Adequate lighting in your home to reduce risk of falls?: Yes Life alert?: No Use of a cane, walker or w/c?: No Grab bars in the bathroom?: No Shower chair or bench in shower?: No Elevated toilet seat or a handicapped toilet?: No  TIMED UP AND GO:  Was the test performed?  Yes  Length of time to ambulate 10 feet: 10 sec Gait steady and fast without use of assistive device  Cognitive Function: 6CIT completed        05/24/2024    3:08 PM 05/21/2023    3:02 PM 07/17/2021    8:30 AM 04/28/2018   10:36 AM  6CIT Screen  What Year? 0 points 0 points 0 points 0 points  What month? 0 points 0 points 0 points 0 points  What time? 0 points 0 points 0 points 0 points  Count back from 20 0 points 0 points 0 points 0 points  Months in reverse 0 points 0 points 2 points 0 points  Repeat phrase 0 points 0 points 0 points 2 points  Total Score 0 points 0 points 2 points 2 points    Immunizations Immunization History  Administered Date(s) Administered   Fluad Quad(high Dose 65+) 06/06/2020   INFLUENZA, HIGH DOSE SEASONAL PF 05/10/2014, 05/18/2018   Influenza Whole 05/24/2013   Influenza, Quadrivalent, Recombinant, Inj, Pf 04/26/2019   Influenza-Unspecified 05/18/2015, 04/28/2017, 06/09/2021, 05/18/2023   PFIZER(Purple Top)SARS-COV-2 Vaccination 08/27/2019, 09/27/2019, 07/01/2020, 09/24/2021  Pneumococcal Conjugate-13 04/18/2014   Pneumococcal Polysaccharide-23 04/12/2013   Tdap 10/16/2013    Screening Tests Health Maintenance  Topic Date Due   Zoster Vaccines- Shingrix (1 of 2) Never done   DTaP/Tdap/Td (2 - Td or Tdap) 10/17/2023   Influenza Vaccine  03/17/2024   COVID-19 Vaccine (5 - 2025-26 season) 04/17/2024   Medicare Annual Wellness (AWV)  05/24/2025   Pneumococcal Vaccine: 50+ Years  Completed   DEXA SCAN  Completed   Meningococcal B Vaccine  Aged Out   Colonoscopy  Discontinued   Hepatitis C Screening  Discontinued    Health Maintenance  Items Addressed:   Additional Screening:  Vision Screening: Recommended annual ophthalmology exams for early detection of glaucoma and other disorders of the eye. Is the patient up to date with their annual eye exam?  Yes  Who is the provider or what is the name of the office in which the patient attends annual eye exams? [facility]  Dental Screening: Recommended annual dental exams for proper oral hygiene  Community Resource Referral / Chronic Care Management: CRR required this visit?  No   CCM required this visit?  No   Plan:    I have personally reviewed and noted the following in the patient's chart:   Medical and social history Use of alcohol, tobacco or illicit drugs  Current medications and supplements including opioid prescriptions. Patient is not currently taking opioid prescriptions. Functional ability and status Nutritional status Physical activity Advanced directives List of other physicians Hospitalizations, surgeries, and ER visits in previous 12 months Vitals Screenings to include cognitive, depression, and falls Referrals and appointments  In addition, I have reviewed and discussed with patient certain preventive protocols, quality metrics, and best practice recommendations. A written personalized care plan for preventive services as well as general preventive health recommendations were provided to patient.   Rojelio LELON Blush, LPN   [id number]   After Visit Summary: (In Person-Printed) AVS printed and given to the patient  Notes: Nothing significant to report at this time.

## 2024-06-26 ENCOUNTER — Other Ambulatory Visit: Payer: Self-pay | Admitting: Internal Medicine

## 2024-06-26 DIAGNOSIS — I10 Essential (primary) hypertension: Secondary | ICD-10-CM

## 2024-09-22 ENCOUNTER — Other Ambulatory Visit: Payer: Self-pay | Admitting: Internal Medicine

## 2024-09-22 DIAGNOSIS — I10 Essential (primary) hypertension: Secondary | ICD-10-CM

## 2025-05-30 ENCOUNTER — Ambulatory Visit
